# Patient Record
Sex: Female | Born: 1940 | Race: White | Hispanic: No | Marital: Married | Smoking: Never smoker
Health system: Southern US, Community
[De-identification: ages and names within clinical notes are randomized; demographics above are authoritative.]

## PROBLEM LIST (undated history)

## (undated) DIAGNOSIS — K219 Gastro-esophageal reflux disease without esophagitis: Secondary | ICD-10-CM

## (undated) DIAGNOSIS — R413 Other amnesia: Principal | ICD-10-CM

## (undated) DIAGNOSIS — M1990 Unspecified osteoarthritis, unspecified site: Secondary | ICD-10-CM

## (undated) DIAGNOSIS — G43909 Migraine, unspecified, not intractable, without status migrainosus: Secondary | ICD-10-CM

## (undated) HISTORY — PX: TUBAL LIGATION: SHX77

## (undated) HISTORY — PX: BREAST LUMPECTOMY: SHX2

## (undated) HISTORY — PX: WRIST SURGERY: SHX841

## (undated) HISTORY — PX: APPENDECTOMY: SHX54

## (undated) HISTORY — DX: Migraine, unspecified, not intractable, without status migrainosus: G43.909

## (undated) HISTORY — DX: Gastro-esophageal reflux disease without esophagitis: K21.9

## (undated) HISTORY — DX: Other amnesia: R41.3

## (undated) HISTORY — DX: Unspecified osteoarthritis, unspecified site: M19.90

---

## 1988-12-13 DIAGNOSIS — C50919 Malignant neoplasm of unspecified site of unspecified female breast: Secondary | ICD-10-CM

## 1988-12-13 HISTORY — DX: Malignant neoplasm of unspecified site of unspecified female breast: C50.919

## 1998-11-10 ENCOUNTER — Other Ambulatory Visit: Admission: RE | Admit: 1998-11-10 | Discharge: 1998-11-10 | Payer: Self-pay | Admitting: *Deleted

## 2000-01-27 ENCOUNTER — Other Ambulatory Visit: Admission: RE | Admit: 2000-01-27 | Discharge: 2000-01-27 | Payer: Self-pay | Admitting: *Deleted

## 2000-02-10 ENCOUNTER — Encounter: Admission: RE | Admit: 2000-02-10 | Discharge: 2000-02-10 | Payer: Self-pay | Admitting: *Deleted

## 2000-02-10 ENCOUNTER — Encounter: Payer: Self-pay | Admitting: *Deleted

## 2001-01-30 ENCOUNTER — Other Ambulatory Visit: Admission: RE | Admit: 2001-01-30 | Discharge: 2001-01-30 | Payer: Self-pay | Admitting: *Deleted

## 2001-02-16 ENCOUNTER — Encounter: Payer: Self-pay | Admitting: *Deleted

## 2001-02-16 ENCOUNTER — Encounter: Admission: RE | Admit: 2001-02-16 | Discharge: 2001-02-16 | Payer: Self-pay | Admitting: *Deleted

## 2002-02-01 ENCOUNTER — Other Ambulatory Visit: Admission: RE | Admit: 2002-02-01 | Discharge: 2002-02-01 | Payer: Self-pay | Admitting: *Deleted

## 2002-03-05 ENCOUNTER — Encounter: Payer: Self-pay | Admitting: *Deleted

## 2002-03-05 ENCOUNTER — Encounter: Admission: RE | Admit: 2002-03-05 | Discharge: 2002-03-05 | Payer: Self-pay | Admitting: *Deleted

## 2003-02-06 ENCOUNTER — Other Ambulatory Visit: Admission: RE | Admit: 2003-02-06 | Discharge: 2003-02-06 | Payer: Self-pay | Admitting: *Deleted

## 2004-10-29 ENCOUNTER — Ambulatory Visit: Payer: Self-pay | Admitting: Family Medicine

## 2004-11-11 ENCOUNTER — Ambulatory Visit: Payer: Self-pay

## 2005-03-03 ENCOUNTER — Other Ambulatory Visit: Admission: RE | Admit: 2005-03-03 | Discharge: 2005-03-03 | Payer: Self-pay | Admitting: *Deleted

## 2005-05-14 ENCOUNTER — Ambulatory Visit: Payer: Self-pay | Admitting: Family Medicine

## 2005-10-13 ENCOUNTER — Ambulatory Visit: Payer: Self-pay | Admitting: Family Medicine

## 2005-12-22 ENCOUNTER — Ambulatory Visit: Payer: Self-pay | Admitting: Family Medicine

## 2016-04-28 DIAGNOSIS — M810 Age-related osteoporosis without current pathological fracture: Secondary | ICD-10-CM | POA: Insufficient documentation

## 2016-06-22 DIAGNOSIS — R079 Chest pain, unspecified: Secondary | ICD-10-CM

## 2017-10-06 ENCOUNTER — Encounter: Payer: Self-pay | Admitting: Neurology

## 2018-01-11 ENCOUNTER — Ambulatory Visit: Payer: Medicare Other | Admitting: Neurology

## 2018-03-06 ENCOUNTER — Other Ambulatory Visit: Payer: Self-pay

## 2018-03-06 ENCOUNTER — Ambulatory Visit: Payer: Medicare Other | Admitting: Neurology

## 2018-03-06 ENCOUNTER — Encounter: Payer: Self-pay | Admitting: Neurology

## 2018-03-06 DIAGNOSIS — R413 Other amnesia: Secondary | ICD-10-CM | POA: Diagnosis not present

## 2018-03-06 HISTORY — DX: Other amnesia: R41.3

## 2018-03-06 MED ORDER — RIVASTIGMINE 4.6 MG/24HR TD PT24: 4.6000 mg | MEDICATED_PATCH | Freq: Every day | TRANSDERMAL | 5 refills | Status: DC

## 2018-03-06 NOTE — Progress Notes (Signed)
Reason for visit: Memory disturbance  Referring physician: Dr. Stark Bray Wohler is a 77 y.o. female  History of present illness:  Ms. Kyer is a 77 year old right-handed white female with a history of memory problems over the last 1 year.  The patient comes into the office today with her husband and her daughter.  The patient apparently has had some increasing problems with word finding and with remembering names for people.  She has had problems keeping up with the date.  She cannot find objects in the kitchen, she has had problems with keeping up with recipes with cooking.  The patient does not do a lot of cooking currently.  She has not been driving a car much, she has some troubles with directions with driving.  The patient is having difficulty keeping up with her medications and appointments, her husband has started doing this for her over the last several months.  The husband has always done the finances.  The patient reports that she sleeps well at night, she has a good energy level during the day.  She denies any numbness or weakness of the face, arms, or legs.  She has not had any significant problems with balance but she did fall 2 weeks ago and fractured her left foot.  She indicates that both her parents had memory problems.  She denies any issues controlling the bowels or the bladder.  She is sent to this office for further evaluation.  Apparently she has already had some blood work and she had MRI of the brain done at [facility].  I do not have the results of these evaluations.  The patient was placed on Namenda but she could not tolerate the medication secondary to nausea.  She has had some problems with weight loss, she is just not hungry anymore.  Past Medical History:  Diagnosis Date  . Memory difficulty 03/06/2018    Past Surgical History:  Procedure Laterality Date  . BREAST LUMPECTOMY    . TUBAL LIGATION    . WRIST SURGERY      Family History    Problem Relation Age of Onset  . Dementia Mother   . Cancer Mother   . Dementia Father   . Depression Father   . Cancer Brother   . Cancer Sister     Social history:  reports that she has never smoked. She has never used smokeless tobacco. She reports that she drinks alcohol. She reports that she does not use drugs.  Medications:  Prior to Admission medications   Medication Sig Start Date End Date Taking? Authorizing Provider  alendronate (FOSAMAX) 70 MG tablet Take 70 mg by mouth once a week. 02/18/18  Yes [provider]  ranitidine (ZANTAC) 300 MG tablet Take 1 tablet by mouth daily. 05/27/17  Yes [provider]  venlafaxine XR (EFFEXOR-XR) 37.5 MG 24 hr capsule Take 37.5 mg by mouth daily. 12/31/17  Yes [provider]      Allergies  Allergen Reactions  . Codeine Itching  . Isosorbide Nitrate Other (See Comments)    unknown Other reaction(s): Other (See Comments) unknown   . Morphine Other (See Comments)    unknown unknown     ROS:  Out of a complete 14 system review of symptoms, the patient complains only of the following symptoms, and all other reviewed systems are negative.  Decreased energy Memory loss, confusion  Blood pressure (!) 140/96, pulse 76, height 5\' 1"  (1.549 m), weight 97  lb (44 kg).  Physical Exam  General: The patient is alert and cooperative at the time of the examination.  The patient is thin.  Eyes: Pupils are equal, round, and reactive to light. Discs are flat bilaterally.  Neck: The neck is supple, no carotid bruits are noted.  Respiratory: The respiratory examination is clear.  Cardiovascular: The cardiovascular examination reveals a regular rate and rhythm, no obvious murmurs or rubs are noted.  Skin: Extremities are without significant edema.  Neurologic Exam  Mental status: The patient is alert and oriented x 2 at the time of the examination (not oriented to date). The Mini-Mental status examination  done today shows a total score of 23/30.  Cranial nerves: Facial symmetry is present. There is good sensation of the face to pinprick and soft touch bilaterally. The strength of the facial muscles and the muscles to head turning and shoulder shrug are normal bilaterally. Speech is well enunciated, no aphasia or dysarthria is noted. Extraocular movements are full. Visual fields are full. The tongue is midline, and the patient has symmetric elevation of the soft palate. No obvious hearing deficits are noted.  Motor: The motor testing reveals 5 over 5 strength of all 4 extremities. Good symmetric motor tone is noted throughout.  Sensory: Sensory testing is intact to pinprick, soft touch, vibration sensation, and position sense on all 4 extremities. No evidence of extinction is noted.  Coordination: Cerebellar testing reveals good finger-nose-finger and heel-to-shin bilaterally.  Gait and station: Gait is normal. Tandem gait is normal. Romberg is negative. No drift is seen.  Reflexes: Deep tendon reflexes are symmetric and normal bilaterally, with exception that the ankle jerk reflexes are depressed bilaterally. Toes are downgoing bilaterally.   Assessment/Plan:  1.  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  The patient has been tried on Namenda and could not tolerate the drug.  She will be given a trial on low-dose Exelon patch, but I am very concerned about problems with weight loss.  If this continues, the medication will need to be stopped.  The patient is to contact our office if she is interested in learning more about research for memory issues.  She will follow-up otherwise in 6 months.  We will need to get the report of the MRI of the brain done.     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  The small  vessel changes were mild.  Jill Alexanders MD 03/06/2018 9:54 AM  [facility] [address] [place], [place] [address]  Phone [phone] Fax [phone]

## 2018-03-06 NOTE — Patient Instructions (Signed)
   We will start Exelon patch for the memroy.  Begin Exelon patch. Look out for side effects that may include nausea, diarrhea, weight loss, or stomach cramps. This medication will also cause a runny nose, therefore there is no need for allergy medications for this purpose.

## 2018-03-07 ENCOUNTER — Telehealth: Payer: Self-pay | Admitting: *Deleted

## 2018-03-07 ENCOUNTER — Telehealth: Payer: Self-pay | Admitting: Neurology

## 2018-03-07 MED ORDER — MEMANTINE HCL 5 MG PO TABS
ORAL_TABLET | ORAL | 1 refills | Status: DC
Start: 2018-03-07 — End: 2018-04-26

## 2018-03-07 NOTE — Addendum Note (Signed)
Addended by: Kathrynn Ducking on: 03/07/2018 01:26 PM   Modules accepted: Orders

## 2018-03-07 NOTE — Telephone Encounter (Signed)
PA exelon patch denied. Pt must try/fail rivastigmine generic capsule first. I see where you called in Namenda instead. Just FYI in case patient cannot tolerate Namenda.

## 2018-03-07 NOTE — Telephone Encounter (Signed)
Pt husband(on DPR) is asking for a call to discuss rivastigmine (EXELON) 4.6 mg/24hr

## 2018-03-07 NOTE — Telephone Encounter (Signed)
The prescription for Exelon patch is too expensive.  The husband indicates that they want to retry the Namenda in low-dose to see if this is tolerated.  We will start 5 mg daily for 2 weeks and then go to 5 mg twice daily.

## 2018-03-07 NOTE — Telephone Encounter (Signed)
Called and spoke with husband (on [place]). He stated insurance does not cover Exelon patch and was told it would be $500. I advised we received request to do PA this am and it was completed and submitted. Waiting on determination. Should hear within 72 hr per insurance. He is wondering how much it will cost. I advised if it gets approved I can call pharmacy to see how much it will cost and let him know.   He is wondering if there is an alternative that can be given as a cheaper option. Inquired about rivastigmine capsule (this is on the formulary). Advised I will send message to Dr. Jannifer Franklin as well.  He verbalized understanding.

## 2018-03-07 NOTE — Telephone Encounter (Signed)
Received fax notification that PA denied. Pt must try/fail generic rivastigmine capsule first. Notified MD to advise on next steps.

## 2018-03-07 NOTE — Telephone Encounter (Signed)
Initiated PA Exelon patch 4.6 mg on covermymeds. Key: [id number]. In process of completing.

## 2018-03-07 NOTE — Telephone Encounter (Signed)
Submitted PA. Waiting on determination.  "[employer] is reviewing your PA request. Typically an electronic response will be received within 72 hours"

## 2018-04-26 ENCOUNTER — Other Ambulatory Visit: Payer: Self-pay | Admitting: Neurology

## 2018-08-23 ENCOUNTER — Other Ambulatory Visit: Payer: Self-pay | Admitting: Neurology

## 2018-09-12 ENCOUNTER — Encounter: Payer: Self-pay | Admitting: Neurology

## 2018-09-12 ENCOUNTER — Ambulatory Visit (INDEPENDENT_AMBULATORY_CARE_PROVIDER_SITE_OTHER): Payer: Medicare Other | Admitting: Neurology

## 2018-09-12 VITALS — BP 141/80 | HR 71 | Ht 61.0 in | Wt 96.5 lb

## 2018-09-12 DIAGNOSIS — R413 Other amnesia: Secondary | ICD-10-CM | POA: Diagnosis not present

## 2018-09-12 NOTE — Progress Notes (Signed)
Reason for visit: Memory disturbance  Alexandria Jones is an 77 y.o. female  History of present illness:  Alexandria Jones is a 77 year old right-handed white female with a history of a progressive memory disturbance.  The patient has been able to get back on low-dose Namenda taking 5 mg twice daily and she has tolerated this well.  The patient is maintaining her weight, she remains quite thin.  She does not operate a motor vehicle, she needs help keeping up with medications and appointments.  Her husband does the finances.  The patient is sleeping well at night, she has a good energy level during the day.  She has not had any safety issues at home, she does volunteer once a week and seems to do well with this.  She returns to this office for an evaluation.  Past Medical History:  Diagnosis Date  . Memory difficulty 03/06/2018    Past Surgical History:  Procedure Laterality Date  . BREAST LUMPECTOMY    . TUBAL LIGATION    . WRIST SURGERY      Family History  Problem Relation Age of Onset  . Dementia Mother   . Cancer Mother   . Dementia Father   . Depression Father   . Cancer Brother   . Cancer Sister     Social history:  reports that she has never smoked. She has never used smokeless tobacco. She reports that she drinks alcohol. She reports that she does not use drugs.    Allergies  Allergen Reactions  . Codeine Itching  . Isosorbide Nitrate Other (See Comments)    unknown Other reaction(s): Other (See Comments) unknown   . Morphine Other (See Comments)    unknown unknown     Medications:  Prior to Admission medications   Medication Sig Start Date End Date Taking? Authorizing Provider  alendronate (FOSAMAX) 70 MG tablet Take 70 mg by mouth once a week. 02/18/18  Yes [provider]  memantine (NAMENDA) 5 MG tablet TAKE 1 TABLET BY MOUTH TWICE DAILY 08/23/18  Yes Kathrynn Ducking, MD  venlafaxine XR (EFFEXOR-XR) 37.5 MG 24 hr capsule Take 37.5 mg by  mouth daily. 12/31/17  Yes [provider]    ROS:  Out of a complete 14 system review of symptoms, the patient complains only of the following symptoms, and all other reviewed systems are negative.  Snoring, sleep talking Memory loss  Blood pressure (!) 141/80, pulse 71, height 5\' 1"  (1.549 m), weight 96 lb 8 oz (43.8 kg).  Physical Exam  General: The patient is alert and cooperative at the time of the examination.  Skin: No significant peripheral edema is noted.   Neurologic Exam  Mental status: The patient is alert and oriented x 2 at the time of the examination (not oriented to date). The Mini-Mental status examination done today shows a total score of 19/30.   Cranial nerves: Facial symmetry is present. Speech is normal, no aphasia or dysarthria is noted. Extraocular movements are full. Visual fields are full.  Motor: The patient has good strength in all 4 extremities.  Sensory examination: Soft touch sensation is symmetric on the face, arms, and legs.  Coordination: The patient has good finger-nose-finger and heel-to-shin bilaterally.  Gait and station: The patient has a normal gait. Tandem gait is unsteady.  Romberg is negative. No drift is seen.  Reflexes: Deep tendon reflexes are symmetric.   Assessment/Plan:  1.  Progressive memory disturbance  The patient is able to  tolerate low-dose Namenda, we will keep this prescription going for now.  The patient lives at home with her husband who helps keep track of her medications.  The patient does not operate a motor vehicle.  She will follow-up in 6 months.  Greater than 50% of the visit was spent in counseling and coordination of care.  Face-to-face time with the patient was 20 minutes.   Jill Alexanders MD 09/12/2018 12:03 PM  [facility] [address] [place],  [address]  Phone [phone] Fax [phone]

## 2019-02-27 ENCOUNTER — Ambulatory Visit: Payer: Medicare Other | Admitting: Neurology

## 2019-02-27 ENCOUNTER — Ambulatory Visit: Payer: Medicare Other | Admitting: Nurse Practitioner

## 2019-03-09 ENCOUNTER — Other Ambulatory Visit: Payer: Self-pay | Admitting: Neurology

## 2019-03-14 ENCOUNTER — Other Ambulatory Visit: Payer: Self-pay

## 2019-03-14 ENCOUNTER — Encounter: Payer: Self-pay | Admitting: Neurology

## 2019-03-14 ENCOUNTER — Ambulatory Visit (INDEPENDENT_AMBULATORY_CARE_PROVIDER_SITE_OTHER): Payer: Medicare Other | Admitting: Neurology

## 2019-03-14 DIAGNOSIS — R413 Other amnesia: Secondary | ICD-10-CM

## 2019-03-14 NOTE — Progress Notes (Signed)
I have read the note, and I agree with the clinical assessment and plan.  Tyrice Hewitt K Maison Kestenbaum   

## 2019-03-14 NOTE — Progress Notes (Signed)
Virtual Visit via Video Note  I connected with Alexandria Jones on 03/14/19 at 10:15 AM EDT by a video enabled telemedicine application and verified that I am speaking with the correct person using two identifiers.   I discussed the limitations of evaluation and management by telemedicine and the availability of in person appointments. The patient expressed understanding and agreed to proceed.  History of Present Illness: 03/14/2019 SS: Alexandria Jones is a 78 yo female with history of progressive memory disturbance. She is tolerating low-dose Namenda 5 mg twice daily.  I did a virtual chat with Alexandria Jones and her husband today.  She reports that her memory is about the same, some days are worse than others.  She denies any major changes.  She currently lives with her husband.  He assists her with her medications, remembering appointments.  She is able to do her housework, ADLs.  Her husband does most of the cooking and manages the finances.  In the past they have managed the finances jointly.  She has not been driving for several months due to issues of getting lost.  She is currently taking Namenda 5 mg twice daily and is tolerating the medication well.  She reports that her appetite is fair, denies any weight loss.  She reports during the day she enjoys having lunch with friends, watching TV, reading.  She reports they were going to the gym however they have not been doing that recently due to the coronavirus.  She denies any changes in her medications or in her medical history.  She presents today for a virtual follow-up.   09/12/2018 Dr. Jannifer Franklin: Alexandria Jones is a 78 year old right-handed white female with a history of a progressive memory disturbance.  The patient has been able to get back on low-dose Namenda taking 5 mg twice daily and she has tolerated this well.  The patient is maintaining her weight, she remains quite thin.  She does not operate a motor vehicle, she needs help keeping up with  medications and appointments.  Her husband does the finances.  The patient is sleeping well at night, she has a good energy level during the day.  She has not had any safety issues at home, she does volunteer once a week and seems to do well with this.  She returns to this office for an evaluation.   Observations/Objective: I reviewed medications and medical history  Alert, appropriate verbal response, speech is clear, concise Symmetric facial movement, no facial droop, symmetric shoulder shrug, no arm drift, able to bring finger-to-nose to extension, gait is intact, steady, able to stand from seated position without pushing off  She scored a 17/22 on a Moca-Blind   Assessment and Plan: 1.  Memory disturbance  Overall she appears to be doing quite well.  She denies any major changes in her memory.  I did a Moca-blind and she scored 17/22 (normal >18). MMSE was 19/30 in October 2019. She will continue taking Namenda 5 mg twice daily.  She is tolerating medication well, has been unable to tolerate higher doses in the past. She doesn't need a refill.  We discussed best way to promote memory is to eat healthy, exercise, do brain stimulating exercises, manage risk factors.  She will follow-up in 6 months or sooner if needed.  I advised that if her symptoms worsen or she develops any symptoms she should let us know.  Follow Up Instructions: 6 months for a revisit, memory test   I discussed the assessment  and treatment plan with the patient. The patient was provided an opportunity to ask questions and all were answered. The patient agreed with the plan and demonstrated an understanding of the instructions.   The patient was advised to call back or seek an in-person evaluation if the symptoms worsen or if the condition fails to improve as anticipated.  I provided 25 minutes of non-face-to-face time during this encounter.   Evangeline Dakin, DNP  [facility] [address],  [place], [place] [address] [phone]

## 2019-04-25 ENCOUNTER — Telehealth: Payer: Self-pay

## 2019-04-25 NOTE — Telephone Encounter (Signed)
I called pt to schedule her for the 09/13/2019 follow up as recommended by Judson Roch, NP at the last visit. No answer, left a message asking her to call me back. If pt calls back, please assist her with this.

## 2019-08-18 ENCOUNTER — Other Ambulatory Visit: Payer: Self-pay | Admitting: Neurology

## 2019-09-06 ENCOUNTER — Other Ambulatory Visit: Payer: Self-pay | Admitting: Neurology

## 2019-11-01 ENCOUNTER — Other Ambulatory Visit: Payer: Self-pay | Admitting: *Deleted

## 2019-11-01 MED ORDER — MEMANTINE HCL 5 MG PO TABS: 5.0000 mg | ORAL_TABLET | Freq: Two times a day (BID) | ORAL | 5 refills | Status: DC

## 2019-11-01 NOTE — Telephone Encounter (Signed)
LMVM for pts husband to call back to r/s appt with SS/NP.

## 2019-12-18 DIAGNOSIS — Z0000 Encounter for general adult medical examination without abnormal findings: Secondary | ICD-10-CM | POA: Diagnosis not present

## 2019-12-18 DIAGNOSIS — Z6820 Body mass index (BMI) 20.0-20.9, adult: Secondary | ICD-10-CM | POA: Diagnosis not present

## 2019-12-25 DIAGNOSIS — F320 Major depressive disorder, single episode, mild: Secondary | ICD-10-CM | POA: Diagnosis not present

## 2019-12-25 DIAGNOSIS — F0280 Dementia in other diseases classified elsewhere without behavioral disturbance: Secondary | ICD-10-CM | POA: Diagnosis not present

## 2019-12-25 DIAGNOSIS — K219 Gastro-esophageal reflux disease without esophagitis: Secondary | ICD-10-CM | POA: Diagnosis not present

## 2019-12-25 DIAGNOSIS — Z6820 Body mass index (BMI) 20.0-20.9, adult: Secondary | ICD-10-CM | POA: Diagnosis not present

## 2020-01-07 DIAGNOSIS — M79671 Pain in right foot: Secondary | ICD-10-CM | POA: Diagnosis not present

## 2020-01-21 DIAGNOSIS — M79671 Pain in right foot: Secondary | ICD-10-CM | POA: Diagnosis not present

## 2020-01-28 ENCOUNTER — Telehealth (INDEPENDENT_AMBULATORY_CARE_PROVIDER_SITE_OTHER): Payer: Medicare PPO | Admitting: Family Medicine

## 2020-01-28 ENCOUNTER — Ambulatory Visit: Payer: Medicare PPO | Admitting: Family Medicine

## 2020-01-28 ENCOUNTER — Encounter: Payer: Self-pay | Admitting: Family Medicine

## 2020-01-28 ENCOUNTER — Other Ambulatory Visit: Payer: Self-pay

## 2020-01-28 DIAGNOSIS — R319 Hematuria, unspecified: Secondary | ICD-10-CM

## 2020-01-28 DIAGNOSIS — N3001 Acute cystitis with hematuria: Secondary | ICD-10-CM

## 2020-01-28 DIAGNOSIS — K219 Gastro-esophageal reflux disease without esophagitis: Secondary | ICD-10-CM | POA: Diagnosis not present

## 2020-01-28 DIAGNOSIS — R112 Nausea with vomiting, unspecified: Secondary | ICD-10-CM

## 2020-01-28 LAB — POCT URINALYSIS DIPSTICK
Bilirubin, UA: NEGATIVE
Glucose, UA: NEGATIVE
Ketones, UA: NEGATIVE
Nitrite, UA: POSITIVE
Protein, UA: POSITIVE — AB
Spec Grav, UA: 1.02 (ref 1.010–1.025)
Urobilinogen, UA: 0.2 E.U./dL
pH, UA: 6 (ref 5.0–8.0)

## 2020-01-28 MED ORDER — CIPROFLOXACIN HCL 250 MG PO TABS: 250.0000 mg | ORAL_TABLET | Freq: Two times a day (BID) | ORAL | 0 refills | Status: DC

## 2020-01-28 MED ORDER — OMEPRAZOLE 20 MG PO CPDR: 20.0000 mg | DELAYED_RELEASE_CAPSULE | Freq: Every day | ORAL | 3 refills | Status: DC

## 2020-01-28 NOTE — Patient Instructions (Signed)
Gerd: omeprazole 20 mg once daily. UTI (Bladder infection) - given cipro 250 mg one twice a day for 7 days. Fo Follow up in 2 weeks for repeat Urinalysis.  Gastroesophageal Reflux Disease, Adult (GERD) Gastroesophageal reflux (GER) happens when acid from the stomach flows up into the tube that connects the mouth and the stomach (esophagus). Normally, food travels down the esophagus and stays in the stomach to be digested. With GER, food and stomach acid sometimes move back up into the esophagus. You may have a disease called gastroesophageal reflux disease (GERD) if the reflux:  Happens often.  Causes frequent or very bad symptoms.  Causes problems such as damage to the esophagus. When this happens, the esophagus becomes sore and swollen (inflamed). Over time, GERD can make small holes (ulcers) in the lining of the esophagus. What are the causes? This condition is caused by a problem with the muscle between the esophagus and the stomach. When this muscle is weak or not normal, it does not close properly to keep food and acid from coming back up from the stomach. The muscle can be weak because of:  Tobacco use.  Pregnancy.  Having a certain type of hernia (hiatal hernia).  Alcohol use.  Certain foods and drinks, such as coffee, chocolate, onions, and peppermint. What increases the risk? You are more likely to develop this condition if you:  Are overweight.  Have a disease that affects your connective tissue.  Use NSAID medicines. What are the signs or symptoms? Symptoms of this condition include:  Heartburn.  Difficult or painful swallowing.  The feeling of having a lump in the throat.  A bitter taste in the mouth.  Bad breath.  Having a lot of saliva.  Having an upset or bloated stomach.  Belching.  Chest pain. Different conditions can cause chest pain. Make sure you see your doctor if you have chest pain.  Shortness of breath or noisy breathing  (wheezing).  Ongoing (chronic) cough or a cough at night.  Wearing away of the surface of teeth (tooth enamel).  Weight loss. How is this treated? Treatment will depend on how bad your symptoms are. Your doctor may suggest:  Changes to your diet.  Medicine.  Surgery. Follow these instructions at home: Eating and drinking   Follow a diet as told by your doctor. You may need to avoid foods and drinks such as: ? Coffee and tea (with or without caffeine). ? Drinks that contain alcohol. ? Energy drinks and sports drinks. ? Bubbly (carbonated) drinks or sodas. ? Chocolate and cocoa. ? Peppermint and mint flavorings. ? Garlic and onions. ? Horseradish. ? Spicy and acidic foods. These include peppers, chili powder, curry powder, vinegar, hot sauces, and BBQ sauce. ? Citrus fruit juices and citrus fruits, such as oranges, lemons, and limes. ? Tomato-based foods. These include red sauce, chili, salsa, and pizza with red sauce. ? Fried and fatty foods. These include donuts, french fries, potato chips, and high-fat dressings. ? High-fat meats. These include hot dogs, rib eye steak, sausage, ham, and bacon. ? High-fat dairy items, such as whole milk, butter, and cream cheese.  Eat small meals often. Avoid eating large meals.  Avoid drinking large amounts of liquid with your meals.  Avoid eating meals during the 2-3 hours before bedtime.  Avoid lying down right after you eat.  Do not exercise right after you eat. Lifestyle   Do not use any products that contain nicotine or tobacco. These include cigarettes, e-cigarettes, and chewing  tobacco. If you need help quitting, ask your doctor.  Try to lower your stress. If you need help doing this, ask your doctor.  If you are overweight, lose an amount of weight that is healthy for you. Ask your doctor about a safe weight loss goal. General instructions  Pay attention to any changes in your symptoms.  Take over-the-counter and  prescription medicines only as told by your doctor. Do not take aspirin, ibuprofen, or other NSAIDs unless your doctor says it is okay.  Wear loose clothes. Do not wear anything tight around your waist.  Raise (elevate) the head of your bed about 6 inches (15 cm).  Avoid bending over if this makes your symptoms worse.  Keep all follow-up visits as told by your doctor. This is important. Contact a doctor if:  You have new symptoms.  You lose weight and you do not know why.  You have trouble swallowing or it hurts to swallow.  You have wheezing or a cough that keeps happening.  Your symptoms do not get better with treatment.  You have a hoarse voice. Get help right away if:  You have pain in your arms, neck, jaw, teeth, or back.  You feel sweaty, dizzy, or light-headed.  You have chest pain or shortness of breath.  You throw up (vomit) and your throw-up looks like blood or coffee grounds.  You pass out (faint).  Your poop (stool) is bloody or black.  You cannot swallow, drink, or eat. Summary  If a person has gastroesophageal reflux disease (GERD), food and stomach acid move back up into the esophagus and cause symptoms or problems such as damage to the esophagus.  Treatment will depend on how bad your symptoms are.  Follow a diet as told by your doctor.  Take all medicines only as told by your doctor. This information is not intended to replace advice given to you by your health care provider. Make sure you discuss any questions you have with your health care provider. Document Revised: 06/07/2018 Document Reviewed: 06/07/2018 Elsevier Patient Education  2020 [place]. Acute Urinary Retention, Female  Acute urinary retention means that you cannot pee (urinate) at all, or that you pee too little and your bladder is not emptied completely. If it is not treated, it can lead to kidney damage or other serious problems. Follow these instructions at home:  Take  over-the-counter and prescription medicines only as told by your doctor. Ask your doctor what medicines you should stay away from. Do not take any medicine unless your doctor says it is okay to do so.  If you were sent home with a tube that drains pee from the bladder (catheter), take care of it as told by your doctor.  Drink enough fluid to keep your pee clear or pale yellow.  If you were given an antibiotic, take it as told by your doctor. Do not stop taking the antibiotic even if you start to feel better.  Do not use any products that contain nicotine or tobacco, such as cigarettes and e-cigarettes. If you need help quitting, ask your doctor.  Watch for changes in your symptoms. Tell your doctor about them.  If told, keep track of any changes in your blood pressure at home. Tell your doctor about them.  Keep all follow-up visits as told by your doctor. This is important. Contact a doctor if:  You have spasms or you leak pee when you have spasms. Get help right away if:  You  have chills or a fever.  You have blood in your pee.  You have a tube that drains the bladder and: ? The tube stops draining pee. ? The tube falls out. Summary  Acute urinary retention means that you cannot pee at all, or that you pee too little and your bladder is not emptied completely. If it is not treated, it can result in kidney damage or other serious problems.  If you were sent home with a tube that drains pee from the bladder, take care of it as told by your doctor.  Pay attention to any changes in your symptoms. Tell your doctor about them. This information is not intended to replace advice given to you by your health care provider. Make sure you discuss any questions you have with your health care provider. Document Revised: 11/11/2017 Document Reviewed: 12/31/2016 Elsevier Patient Education  [place].

## 2020-01-28 NOTE — Progress Notes (Signed)
Started as International aid/development worker but converted to Acute Office Visit  Subjective:    Patient ID: Alexandria Jones, female    DOB: 12-27-1940, 79 y.o.   MRN: [id number]  Chief Complaint  Patient presents with  . Dysuria  . Nausea  . Emesis    HPI: Patient is complaining of nausea, vomiting after meal without abdominal pain. Vomits whatever she eats. Denies weight loss.  Her appetite has significantly decreased over the last month.  Her husband reports she has probably vomited 15 times in the last month.  Past Medical History:  Diagnosis Date  . Breast cancer ([place]) 1990   cured S/P radiation treatment & chemo  . GERD (gastroesophageal reflux disease)   . Memory difficulty 03/06/2018  . Migraines   . Osteoarthritis     Past Surgical History:  Procedure Laterality Date  . APPENDECTOMY    . BREAST LUMPECTOMY    . left lumpectomy  1990  . TUBAL LIGATION    . WRIST SURGERY      Family History  Problem Relation Age of Onset  . Dementia Mother   . Cancer Mother   . Dementia Father   . Depression Father   . Cancer Brother   . Cancer Sister     Social History   Socioeconomic History  . Marital status: Married    Spouse name: Not on file  . Number of children: Not on file  . Years of education: Masters- education  . Highest education level: Not on file  Occupational History  . Not on file  Tobacco Use  . Smoking status: Never Smoker  . Smokeless tobacco: Never Used  Substance and Sexual Activity  . Alcohol use: Yes    Comment: Rare- wine  . Drug use: Never  . Sexual activity: Not on file  Other Topics Concern  . Not on file  Social History Narrative   Lives with husband   Caffeine use: sometimes tea   Right handed    Social Determinants of Health   Financial Resource Strain:   . Difficulty of Paying Living Expenses: Not on file  Food Insecurity:   . Worried About Charity fundraiser in the Last Year: Not on file  . Ran Out of Food in the Last Year: Not on file   Transportation Needs:   . Lack of Transportation (Medical): Not on file  . Lack of Transportation (Non-Medical): Not on file  Physical Activity:   . Days of Exercise per Week: Not on file  . Minutes of Exercise per Session: Not on file  Stress:   . Feeling of Stress : Not on file  Social Connections:   . Frequency of Communication with Friends and Family: Not on file  . Frequency of Social Gatherings with Friends and Family: Not on file  . Attends Religious Services: Not on file  . Active Member of Clubs or Organizations: Not on file  . Attends Archivist Meetings: Not on file  . Marital Status: Not on file  Intimate Partner Violence:   . Fear of Current or Ex-Partner: Not on file  . Emotionally Abused: Not on file  . Physically Abused: Not on file  . Sexually Abused: Not on file    Outpatient Medications Prior to Visit  Medication Sig Dispense Refill  . acetaminophen (TYLENOL) 500 MG tablet Take 1,000 mg by mouth every 6 (six) hours as needed.    . memantine (NAMENDA) 5 MG tablet Take 1 tablet (5 mg total)  by mouth 2 (two) times daily. 60 tablet 5  . venlafaxine XR (EFFEXOR-XR) 37.5 MG 24 hr capsule Take 37.5 mg by mouth daily.  0  . alendronate (FOSAMAX) 70 MG tablet Take 70 mg by mouth once a week.  0   No facility-administered medications prior to visit.    Allergies  Allergen Reactions  . Codeine Itching  . Morphine Nausea Only    unknown unknown   . Isosorbide Nitrate Other (See Comments)    unknown Other reaction(s): Other (See Comments) unknown     Review of Systems  Constitutional: Positive for fatigue. Negative for chills and fever.  HENT: Positive for voice change. Negative for congestion, ear pain and sore throat.        Hoarse   Respiratory: Negative for cough and shortness of breath.   Cardiovascular: Negative for chest pain.  Gastrointestinal: Positive for nausea and vomiting. Negative for abdominal pain, constipation and diarrhea.    Genitourinary: Positive for dysuria and frequency. Negative for urgency.       Comes and goes.  Neurological: Positive for headaches.       Objective:    Physical Exam  There were no vitals taken for this visit. Wt Readings from Last 3 Encounters:  09/12/18 96 lb 8 oz (43.8 kg)  03/06/18 97 lb (44 kg)     Physical Exam Vitals reviewed.  Constitutional:      Appearance: Normal appearance.  Neck:     Vascular: No carotid bruit.  Cardiovascular:     Rate and Rhythm: Normal rate and regular rhythm.     Pulses: Normal pulses.     Heart sounds: Normal heart sounds.  Pulmonary:     Effort: Pulmonary effort is normal.     Breath sounds: Normal breath sounds. No wheezing, rhonchi or rales.  Abdominal:     General: Bowel sounds are normal.     Palpations: Abdomen is soft.     Tenderness: There is suprapubic abdominal tenderness.  Neurological:     Mental Status: ALERT Psychiatric:        Mood and Affect: Mood normal.        Behavior: Behavior normal.       Assessment & Plan:   Problem List Items Addressed This Visit      Digestive   Non-intractable vomiting - Primary    Lab work done.  Differential diagnosis is GERD, urinary tract infection/pyelonephritis, versus biliary dyskinesia or gallstones.  Her exam is inconsistent with gallbladder.  Will treat bladder infection.  Will check blood work.  Start omeprazole 20 mg once daily.      Relevant Medications   omeprazole (PRILOSEC) 20 MG capsule   Other Relevant Orders   CBC with Differential/Platelet (Completed)   Comp. Metabolic Panel (12) (Completed)   Gastroesophageal reflux disease without esophagitis    Start omeprazole 20 mg once daily.      Relevant Medications   omeprazole (PRILOSEC) 20 MG capsule     Genitourinary   Acute cystitis with hematuria    Prescription for Cipro given.  Recommend bland diet.      Relevant Medications   ciprofloxacin (CIPRO) 250 MG tablet   Other Relevant Orders    Urinalysis Dipstick (Completed)   Urine Culture (Completed)       Meds ordered this encounter  Medications  . omeprazole (PRILOSEC) 20 MG capsule    Sig: Take 1 capsule (20 mg total) by mouth daily.    Dispense:  30 capsule  Refill:  3  . ciprofloxacin (CIPRO) 250 MG tablet    Sig: Take 1 tablet (250 mg total) by mouth 2 (two) times daily.    Dispense:  14 tablet    Refill:  0     Rochel Brome, MD

## 2020-01-29 LAB — COMP. METABOLIC PANEL (12)
AST: 17 IU/L (ref 0–40)
Albumin/Globulin Ratio: 1.9 (ref 1.2–2.2)
Albumin: 4.5 g/dL (ref 3.7–4.7)
Alkaline Phosphatase: 97 IU/L (ref 39–117)
BUN/Creatinine Ratio: 14 (ref 12–28)
BUN: 11 mg/dL (ref 8–27)
Bilirubin Total: 0.4 mg/dL (ref 0.0–1.2)
Calcium: 9.8 mg/dL (ref 8.7–10.3)
Chloride: 101 mmol/L (ref 96–106)
Creatinine, Ser: 0.81 mg/dL (ref 0.57–1.00)
GFR calc Af Amer: 79 mL/min/{1.73_m2} (ref >59)
GFR calc non Af Amer: 69 mL/min/{1.73_m2} (ref >59)
Globulin, Total: 2.4 g/dL (ref 1.5–4.5)
Glucose: 99 mg/dL (ref 65–99)
Potassium: 4.7 mmol/L (ref 3.5–5.2)
Sodium: 140 mmol/L (ref 134–144)
Total Protein: 6.9 g/dL (ref 6.0–8.5)

## 2020-01-29 LAB — CBC WITH DIFFERENTIAL/PLATELET
Basophils Absolute: 0 10*3/uL (ref 0.0–0.2)
Basos: 0 %
EOS (ABSOLUTE): 0.1 10*3/uL (ref 0.0–0.4)
Eos: 1 %
Hematocrit: 45.9 % (ref 34.0–46.6)
Hemoglobin: 15.6 g/dL (ref 11.1–15.9)
Immature Grans (Abs): 0 10*3/uL (ref 0.0–0.1)
Immature Granulocytes: 0 %
Lymphocytes Absolute: 1.3 10*3/uL (ref 0.7–3.1)
Lymphs: 14 %
MCH: 30.2 pg (ref 26.6–33.0)
MCHC: 34 g/dL (ref 31.5–35.7)
MCV: 89 fL (ref 79–97)
Monocytes Absolute: 1.2 10*3/uL — ABNORMAL HIGH (ref 0.1–0.9)
Monocytes: 13 %
Neutrophils Absolute: 6.7 10*3/uL (ref 1.4–7.0)
Neutrophils: 72 %
Platelets: 316 10*3/uL (ref 150–450)
RBC: 5.16 x10E6/uL (ref 3.77–5.28)
RDW: 11.9 % (ref 11.7–15.4)
WBC: 9.4 10*3/uL (ref 3.4–10.8)

## 2020-01-31 LAB — URINE CULTURE

## 2020-02-01 DIAGNOSIS — Z1212 Encounter for screening for malignant neoplasm of rectum: Secondary | ICD-10-CM | POA: Diagnosis not present

## 2020-02-01 DIAGNOSIS — Z1211 Encounter for screening for malignant neoplasm of colon: Secondary | ICD-10-CM | POA: Diagnosis not present

## 2020-02-01 LAB — COLOGUARD: Cologuard: NEGATIVE

## 2020-02-03 ENCOUNTER — Encounter: Payer: Self-pay | Admitting: Family Medicine

## 2020-02-03 DIAGNOSIS — R1110 Vomiting, unspecified: Secondary | ICD-10-CM | POA: Insufficient documentation

## 2020-02-03 DIAGNOSIS — N3001 Acute cystitis with hematuria: Secondary | ICD-10-CM | POA: Insufficient documentation

## 2020-02-03 DIAGNOSIS — K219 Gastro-esophageal reflux disease without esophagitis: Secondary | ICD-10-CM | POA: Insufficient documentation

## 2020-02-03 NOTE — Assessment & Plan Note (Addendum)
Lab work done.  Differential diagnosis is GERD, urinary tract infection/pyelonephritis, versus biliary dyskinesia or gallstones.  Her exam is inconsistent with gallbladder.  Will treat bladder infection.  Will check blood work.  Start omeprazole 20 mg once daily.

## 2020-02-03 NOTE — Assessment & Plan Note (Signed)
Prescription for Cipro given.  Recommend bland diet.

## 2020-02-03 NOTE — Assessment & Plan Note (Signed)
Start omeprazole 20 mg once daily.

## 2020-02-11 ENCOUNTER — Other Ambulatory Visit: Payer: Self-pay

## 2020-02-11 ENCOUNTER — Ambulatory Visit: Payer: Medicare PPO

## 2020-02-11 ENCOUNTER — Other Ambulatory Visit (INDEPENDENT_AMBULATORY_CARE_PROVIDER_SITE_OTHER): Payer: Medicare PPO

## 2020-02-11 DIAGNOSIS — R319 Hematuria, unspecified: Secondary | ICD-10-CM

## 2020-02-11 DIAGNOSIS — M79671 Pain in right foot: Secondary | ICD-10-CM | POA: Diagnosis not present

## 2020-02-11 LAB — POCT URINALYSIS DIPSTICK
Blood, UA: NEGATIVE
Glucose, UA: NEGATIVE
Leukocytes, UA: NEGATIVE
Nitrite, UA: NEGATIVE
Protein, UA: POSITIVE — AB
Spec Grav, UA: 1.025 (ref 1.010–1.025)
Urobilinogen, UA: 0.2 E.U./dL
pH, UA: 6 (ref 5.0–8.0)

## 2020-03-02 NOTE — Progress Notes (Signed)
Acute Office Visit  Subjective:    Patient ID: Alexandria Jones, female    DOB: 17-Mar-1941, 79 y.o.   MRN: [id number]  Chief Complaint  Patient presents with  . Hematuria  . Nausea    HPI Patient is in today complaining of blood in her urine and nausea.  Denies abdominal pain.  Patient does experience dysuria.  Denies flank pain.  Past Medical History:  Diagnosis Date  . Breast cancer ([place]) 1990   cured S/P radiation treatment & chemo  . GERD (gastroesophageal reflux disease)   . Memory difficulty 03/06/2018  . Migraines   . Osteoarthritis     Past Surgical History:  Procedure Laterality Date  . APPENDECTOMY    . BREAST LUMPECTOMY    . left lumpectomy  1990  . TUBAL LIGATION    . WRIST SURGERY      Family History  Problem Relation Age of Onset  . Dementia Mother   . Cancer Mother   . Dementia Father   . Depression Father   . Cancer Brother   . Cancer Sister     Social History   Socioeconomic History  . Marital status: Married    Spouse name: Not on file  . Number of children: Not on file  . Years of education: Masters- education  . Highest education level: Not on file  Occupational History  . Not on file  Tobacco Use  . Smoking status: Never Smoker  . Smokeless tobacco: Never Used  Substance and Sexual Activity  . Alcohol use: Yes    Comment: Rare- wine  . Drug use: Never  . Sexual activity: Not on file  Other Topics Concern  . Not on file  Social History Narrative   Lives with husband   Caffeine use: sometimes tea   Right handed    Social Determinants of Health   Financial Resource Strain:   . Difficulty of Paying Living Expenses:   Food Insecurity:   . Worried About Charity fundraiser in the Last Year:   . Arboriculturist in the Last Year:   Transportation Needs:   . Film/video editor (Medical):   Marland Kitchen Lack of Transportation (Non-Medical):   Physical Activity:   . Days of Exercise per Week:   . Minutes of Exercise per Session:    Stress:   . Feeling of Stress :   Social Connections:   . Frequency of Communication with Friends and Family:   . Frequency of Social Gatherings with Friends and Family:   . Attends Religious Services:   . Active Member of Clubs or Organizations:   . Attends Archivist Meetings:   Marland Kitchen Marital Status:   Intimate Partner Violence:   . Fear of Current or Ex-Partner:   . Emotionally Abused:   Marland Kitchen Physically Abused:   . Sexually Abused:     Outpatient Medications Prior to Visit  Medication Sig Dispense Refill  . acetaminophen (TYLENOL) 500 MG tablet Take 1,000 mg by mouth every 6 (six) hours as needed.    . memantine (NAMENDA) 5 MG tablet Take 1 tablet (5 mg total) by mouth 2 (two) times daily. 60 tablet 5  . omeprazole (PRILOSEC) 20 MG capsule Take 1 capsule (20 mg total) by mouth daily. 30 capsule 3  . venlafaxine XR (EFFEXOR-XR) 37.5 MG 24 hr capsule Take 37.5 mg by mouth daily.  0   No facility-administered medications prior to visit.    Allergies  Allergen Reactions  .  Codeine Itching  . Morphine Nausea Only    unknown unknown   . Isosorbide Nitrate Other (See Comments)    unknown Other reaction(s): Other (See Comments) unknown     Review of Systems  Constitutional: Negative for chills, fatigue and fever.  HENT: Negative for congestion, ear pain and sore throat.   Respiratory: Negative for cough and shortness of breath.   Cardiovascular: Negative for chest pain.       Objective:    Physical Exam Vitals reviewed.  Constitutional:      Appearance: Normal appearance. She is normal weight.  Cardiovascular:     Rate and Rhythm: Normal rate and regular rhythm.     Pulses: Normal pulses.     Heart sounds: Normal heart sounds.  Pulmonary:     Effort: Pulmonary effort is normal. No respiratory distress.     Breath sounds: Normal breath sounds.  Abdominal:     General: Abdomen is flat. Bowel sounds are normal.     Palpations: Abdomen is soft.      Tenderness: There is no abdominal tenderness.  Neurological:     Mental Status: She is alert and oriented to person, place, and time.  Psychiatric:        Mood and Affect: Mood normal.        Behavior: Behavior normal.     There were no vitals taken for this visit. Wt Readings from Last 3 Encounters:  09/12/18 96 lb 8 oz (43.8 kg)  03/06/18 97 lb (44 kg)    Health Maintenance Due  Topic Date Due  . TETANUS/TDAP  Never done  . DEXA SCAN  Never done  . PNA vac Low Risk Adult (1 of 2 - PCV13) Never done  . INFLUENZA VACCINE  07/14/2019    There are no preventive care reminders to display for this patient.   No results found for: TSH Lab Results  Component Value Date   WBC 9.4 01/28/2020   HGB 15.6 01/28/2020   HCT 45.9 01/28/2020   MCV 89 01/28/2020   PLT 316 01/28/2020   Lab Results  Component Value Date   NA 140 01/28/2020   K 4.7 01/28/2020   GLUCOSE 99 01/28/2020   BUN 11 01/28/2020   CREATININE 0.81 01/28/2020   BILITOT 0.4 01/28/2020   ALKPHOS 97 01/28/2020   AST 17 01/28/2020   PROT 6.9 01/28/2020   ALBUMIN 4.5 01/28/2020   CALCIUM 9.8 01/28/2020   No results found for: CHOL No results found for: HDL No results found for: LDLCALC No results found for: TRIG No results found for: CHOLHDL No results found for: HGBA1C     Assessment & Plan:   Problem List Items Addressed This Visit      Other   Hematuria - Primary - resolved.   Relevant Orders   Urinalysis Dipstick      Rochel Brome, MD

## 2020-03-03 ENCOUNTER — Telehealth: Payer: Self-pay

## 2020-03-03 NOTE — Telephone Encounter (Signed)
Patient informed of negative cologuard results and the need to repeat test in 3 years.

## 2020-03-09 ENCOUNTER — Encounter: Payer: Self-pay | Admitting: Family Medicine

## 2020-03-09 DIAGNOSIS — R319 Hematuria, unspecified: Secondary | ICD-10-CM | POA: Insufficient documentation

## 2020-03-09 LAB — POCT URINALYSIS DIPSTICK
Blood, UA: NEGATIVE
Leukocytes, UA: NEGATIVE
Nitrite, UA: NEGATIVE

## 2020-03-12 ENCOUNTER — Other Ambulatory Visit: Payer: Self-pay | Admitting: Family Medicine

## 2020-03-17 ENCOUNTER — Other Ambulatory Visit: Payer: Self-pay

## 2020-03-17 ENCOUNTER — Encounter: Payer: Self-pay | Admitting: Nurse Practitioner

## 2020-03-17 ENCOUNTER — Ambulatory Visit: Payer: Medicare PPO | Admitting: Nurse Practitioner

## 2020-03-17 VITALS — BP 128/82 | HR 92 | Temp 97.9°F | Ht 61.5 in | Wt 111.0 lb

## 2020-03-17 DIAGNOSIS — R300 Dysuria: Secondary | ICD-10-CM | POA: Diagnosis not present

## 2020-03-17 DIAGNOSIS — M810 Age-related osteoporosis without current pathological fracture: Secondary | ICD-10-CM

## 2020-03-17 DIAGNOSIS — R3129 Other microscopic hematuria: Secondary | ICD-10-CM | POA: Diagnosis not present

## 2020-03-17 DIAGNOSIS — Z853 Personal history of malignant neoplasm of breast: Secondary | ICD-10-CM

## 2020-03-17 LAB — POCT URINALYSIS DIPSTICK
Glucose, UA: NEGATIVE
Ketones, UA: NEGATIVE
Nitrite, UA: NEGATIVE
Protein, UA: POSITIVE — AB
Spec Grav, UA: 1.02 (ref 1.010–1.025)
Urobilinogen, UA: NEGATIVE E.U./dL — AB
pH, UA: 6 (ref 5.0–8.0)

## 2020-03-17 MED ORDER — NITROFURANTOIN MONOHYD MACRO 100 MG PO CAPS: 100.0000 mg | ORAL_CAPSULE | Freq: Two times a day (BID) | ORAL | 0 refills | Status: DC

## 2020-03-17 NOTE — Patient Instructions (Signed)
Burning with urination Not well controlled, UA dip stick positive for uti, cultures sent off. Pending results. Patient will start on 100 mg nitrofurantoin for 7 days, pending urine culture. Advised to increase fluid.  Hematuria Urine sent for culture. Pending result.     Urinary Tract Infection, Adult A urinary tract infection (UTI) is an infection of any part of the urinary tract. The urinary tract includes:  The kidneys.  The ureters.  The bladder.  The urethra. These organs make, store, and get rid of pee (urine) in the body. What are the causes? This is caused by germs (bacteria) in your genital area. These germs grow and cause swelling (inflammation) of your urinary tract. What increases the risk? You are more likely to develop this condition if:  You have a small, thin tube (catheter) to drain pee.  You cannot control when you pee or poop (incontinence).  You are female, and: ? You use these methods to prevent pregnancy:  A medicine that kills sperm (spermicide).  A device that blocks sperm (diaphragm). ? You have low levels of a female hormone (estrogen). ? You are pregnant.  You have genes that add to your risk.  You are sexually active.  You take antibiotic medicines.  You have trouble peeing because of: ? A prostate that is bigger than normal, if you are female. ? A blockage in the part of your body that drains pee from the bladder (urethra). ? A kidney stone. ? A nerve condition that affects your bladder (neurogenic bladder). ? Not getting enough to drink. ? Not peeing often enough.  You have other conditions, such as: ? Diabetes. ? A weak disease-fighting system (immune system). ? Sickle cell disease. ? Gout. ? Injury of the spine. What are the signs or symptoms? Symptoms of this condition include:  Needing to pee right away (urgently).  Peeing often.  Peeing small amounts often.  Pain or burning when peeing.  Blood in the pee.  Pee that  smells bad or not like normal.  Trouble peeing.  Pee that is cloudy.  Fluid coming from the vagina, if you are female.  Pain in the belly or lower back. Other symptoms include:  Throwing up (vomiting).  No urge to eat.  Feeling mixed up (confused).  Being tired and grouchy (irritable).  A fever.  Watery poop (diarrhea). How is this treated? This condition may be treated with:  Antibiotic medicine.  Other medicines.  Drinking enough water. Follow these instructions at home:  Medicines  Take over-the-counter and prescription medicines only as told by your doctor.  If you were prescribed an antibiotic medicine, take it as told by your doctor. Do not stop taking it even if you start to feel better. General instructions  Make sure you: ? Pee until your bladder is empty. ? Do not hold pee for a long time. ? Empty your bladder after sex. ? Wipe from front to back after pooping if you are a female. Use each tissue one time when you wipe.  Drink enough fluid to keep your pee pale yellow.  Keep all follow-up visits as told by your doctor. This is important. Contact a doctor if:  You do not get better after 1-2 days.  Your symptoms go away and then come back. Get help right away if:  You have very bad back pain.  You have very bad pain in your lower belly.  You have a fever.  You are sick to your stomach (nauseous).  You  are throwing up. Summary  A urinary tract infection (UTI) is an infection of any part of the urinary tract.  This condition is caused by germs in your genital area.  There are many risk factors for a UTI. These include having a small, thin tube to drain pee and not being able to control when you pee or poop.  Treatment includes antibiotic medicines for germs.  Drink enough fluid to keep your pee pale yellow. This information is not intended to replace advice given to you by your health care provider. Make sure you discuss any questions  you have with your health care provider. Document Revised: 11/16/2018 Document Reviewed: 06/08/2018 Elsevier Patient Education  2020 [employer].

## 2020-03-17 NOTE — Assessment & Plan Note (Signed)
Not well controlled, UA dip stick positive for uti, cultures sent off. Pending results. Patient will start on 100 mg nitrofurantoin for 7 days, pending urine culture. Advised to increase fluid.

## 2020-03-17 NOTE — Assessment & Plan Note (Signed)
Urine sent for culture. Pending result.

## 2020-03-17 NOTE — Progress Notes (Signed)
Established Patient Office Visit  Subjective:  Patient ID: Alexandria Jones, female    DOB: 1941-06-24  Age: 79 y.o. MRN: [id number]  CC: Patient  is a 79 year old female. She is here for UTI. The patient's medications were reviewed and reconciled since the patient's last visit.  History details were provided by the patient. The history appears to be reliable.   Chief Complaint  Patient presents with  . Urinary Tract Infection    Burning when urinating    HPI  Alexandria Jones is a 79 y.o. female who complains of urinary frequency, Burning,  urgency and dysuria in the last 2 days, without flank pain, fever, chills, or abnormal vaginal discharge or bleeding.     Past Medical History:  Diagnosis Date  . Breast cancer ([place]) 1990   cured S/P radiation treatment & chemo  . GERD (gastroesophageal reflux disease)   . Memory difficulty 03/06/2018  . Migraines   . Osteoarthritis     Past Surgical History:  Procedure Laterality Date  . APPENDECTOMY    . BREAST LUMPECTOMY    . left lumpectomy  1990  . TUBAL LIGATION    . WRIST SURGERY      Family History  Problem Relation Age of Onset  . Dementia Mother   . Cancer Mother   . Dementia Father   . Depression Father   . Cancer Brother   . Cancer Sister     Social History   Socioeconomic History  . Marital status: Married    Spouse name: Not on file  . Number of children: Not on file  . Years of education: Masters- education  . Highest education level: Not on file  Occupational History  . Not on file  Tobacco Use  . Smoking status: Never Smoker  . Smokeless tobacco: Never Used  Substance and Sexual Activity  . Alcohol use: Yes    Comment: Rare- wine  . Drug use: Never  . Sexual activity: Not on file  Other Topics Concern  . Not on file  Social History Narrative   Lives with husband   Caffeine use: sometimes tea   Right handed    Social Determinants of Health   Financial Resource Strain:   . Difficulty  of Paying Living Expenses:   Food Insecurity:   . Worried About Charity fundraiser in the Last Year:   . Arboriculturist in the Last Year:   Transportation Needs:   . Film/video editor (Medical):   Marland Kitchen Lack of Transportation (Non-Medical):   Physical Activity:   . Days of Exercise per Week:   . Minutes of Exercise per Session:   Stress:   . Feeling of Stress :   Social Connections:   . Frequency of Communication with Friends and Family:   . Frequency of Social Gatherings with Friends and Family:   . Attends Religious Services:   . Active Member of Clubs or Organizations:   . Attends Archivist Meetings:   Marland Kitchen Marital Status:   Intimate Partner Violence:   . Fear of Current or Ex-Partner:   . Emotionally Abused:   Marland Kitchen Physically Abused:   . Sexually Abused:     Outpatient Medications Prior to Visit  Medication Sig Dispense Refill  . acetaminophen (TYLENOL) 500 MG tablet Take 1,000 mg by mouth every 6 (six) hours as needed.    . memantine (NAMENDA) 5 MG tablet Take 1 tablet (5 mg total) by mouth 2 (  two) times daily. 60 tablet 5  . omeprazole (PRILOSEC) 20 MG capsule Take 1 capsule (20 mg total) by mouth daily. 30 capsule 3  . venlafaxine XR (EFFEXOR-XR) 37.5 MG 24 hr capsule TAKE 1 CAPSULE BY MOUTH DAILY 90 capsule 0  . ciprofloxacin (CIPRO) 250 MG tablet Take 1 tablet (250 mg total) by mouth 2 (two) times daily. 14 tablet 0   No facility-administered medications prior to visit.    Allergies  Allergen Reactions  . Codeine Itching  . Morphine Nausea Only    unknown unknown   . Isosorbide Nitrate Other (See Comments)    unknown Other reaction(s): Other (See Comments) unknown     ROS Review of Systems  Constitutional: Positive for fever. Negative for activity change and appetite change.  HENT: Negative for congestion.   Cardiovascular: Negative for chest pain and palpitations.  Gastrointestinal: Negative for abdominal distention and abdominal pain.    Genitourinary: Positive for difficulty urinating, dyspareunia, hematuria and urgency. Negative for flank pain and pelvic pain.       Burning with urination  Musculoskeletal: Negative for arthralgias and myalgias.  Skin: Negative for rash.      Objective:    Physical Exam  Constitutional: She is oriented to person, place, and time. She appears well-developed and well-nourished.  HENT:  Mouth/Throat: Oropharynx is clear and moist.  Eyes: Conjunctivae are normal.  Cardiovascular: Normal rate, regular rhythm and normal heart sounds.  Pulmonary/Chest: Breath sounds normal.  Abdominal: Bowel sounds are normal. There is no abdominal tenderness.  Genitourinary:    Genitourinary Comments: Burning with urination   Musculoskeletal:        General: No tenderness.     Cervical back: Neck supple.  Neurological: She is alert and oriented to person, place, and time.  Skin: Skin is warm. No rash noted.  Psychiatric: She has a normal mood and affect.    BP 128/82 (BP Location: Right Arm, Patient Position: Sitting)   Pulse 92   Temp 97.9 F (36.6 C) (Temporal)   Ht 5' 1.5" (1.562 m)   Wt 111 lb (50.3 kg)   SpO2 97%   BMI 20.63 kg/m  Wt Readings from Last 3 Encounters:  03/17/20 111 lb (50.3 kg)  09/12/18 96 lb 8 oz (43.8 kg)  03/06/18 97 lb (44 kg)     Health Maintenance Due  Topic Date Due  . MAMMOGRAM  12/26/2018  . DEXA SCAN  12/27/2019    There are no preventive care reminders to display for this patient.  No results found for: TSH Lab Results  Component Value Date   WBC 9.4 01/28/2020   HGB 15.6 01/28/2020   HCT 45.9 01/28/2020   MCV 89 01/28/2020   PLT 316 01/28/2020   Lab Results  Component Value Date   NA 140 01/28/2020   K 4.7 01/28/2020   GLUCOSE 99 01/28/2020   BUN 11 01/28/2020   CREATININE 0.81 01/28/2020   BILITOT 0.4 01/28/2020   ALKPHOS 97 01/28/2020   AST 17 01/28/2020   PROT 6.9 01/28/2020   ALBUMIN 4.5 01/28/2020   CALCIUM 9.8 01/28/2020    No results found for: CHOL No results found for: HDL No results found for: LDLCALC No results found for: TRIG No results found for: CHOLHDL No results found for: HGBA1C    Assessment & Plan:  ASSESSMENT: UTI uncomplicated without evidence of pyelonephritis Appears well, in no apparent distress.  Vital signs are normal. The abdomen is soft without tenderness, guarding, mass, rebound or  organomegaly. No CVA tenderness or inguinal adenopathy noted. Urine dipstick shows leukocytes    Treatment per orders - also push fluids, may use Pyridium OTC prn. Call or return to clinic prn if these symptoms worsen or fail to improve as anticipated.    Problem List Items Addressed This Visit      Other   Hematuria    Urine sent for culture. Pending result.       Relevant Orders   Urine Culture   Burning with urination - Primary    Not well controlled, UA dip stick positive for uti, cultures sent off. Pending results. Patient will start on 100 mg nitrofurantoin for 7 days, pending urine culture. Advised to increase fluid.      Relevant Orders   POCT urinalysis dipstick (Completed)      Meds ordered this encounter  Medications  . nitrofurantoin, macrocrystal-monohydrate, (MACROBID) 100 MG capsule    Sig: Take 1 capsule (100 mg total) by mouth 2 (two) times daily.    Dispense:  14 capsule    Refill:  0    Order Specific Question:   Supervising Provider    Answer:   Shelton Silvas    Follow-up: Return if symptoms worsen or fail to improve.    Ivy Lynn, NP

## 2020-03-19 LAB — URINE CULTURE

## 2020-03-24 NOTE — Progress Notes (Signed)
Established Patient Office Visit  Subjective:  Patient ID: Alexandria Jones, female    DOB: Mar 08, 1941  Age: 79 y.o. MRN: [id number]  CC:  Chief Complaint  Patient presents with  . Urinary Tract Infection    HPI Alexandria Jones presents for dysuria.  The patient was seen on March 17, 2020 here in our office by a midlevel provider and treated with Macrobid for a positive urinalysis.  The patient did not receive a phone call about her urine culture which showed intermediate resistance to Macrobid.  She did complete the treatment.  Majority of her symptoms resolved other than this mild dysuria. Past Medical History:  Diagnosis Date  . Breast cancer ([place]) 1990   cured S/P radiation treatment & chemo  . GERD (gastroesophageal reflux disease)   . Memory difficulty 03/06/2018  . Migraines   . Osteoarthritis     Past Surgical History:  Procedure Laterality Date  . APPENDECTOMY    . BREAST LUMPECTOMY    . left lumpectomy  1990  . TUBAL LIGATION    . WRIST SURGERY      Family History  Problem Relation Age of Onset  . Dementia Mother   . Cancer Mother   . Dementia Father   . Depression Father   . Cancer Brother   . Cancer Sister     Social History   Socioeconomic History  . Marital status: Married    Spouse name: Not on file  . Number of children: Not on file  . Years of education: Masters- education  . Highest education level: Not on file  Occupational History  . Not on file  Tobacco Use  . Smoking status: Never Smoker  . Smokeless tobacco: Never Used  Substance and Sexual Activity  . Alcohol use: Yes    Comment: Rare- wine  . Drug use: Never  . Sexual activity: Not on file  Other Topics Concern  . Not on file  Social History Narrative   Lives with husband   Caffeine use: sometimes tea   Right handed    Social Determinants of Health   Financial Resource Strain:   . Difficulty of Paying Living Expenses:   Food Insecurity:   . Worried About Paediatric nurse in the Last Year:   . Arboriculturist in the Last Year:   Transportation Needs:   . Film/video editor (Medical):   Marland Kitchen Lack of Transportation (Non-Medical):   Physical Activity:   . Days of Exercise per Week:   . Minutes of Exercise per Session:   Stress:   . Feeling of Stress :   Social Connections:   . Frequency of Communication with Friends and Family:   . Frequency of Social Gatherings with Friends and Family:   . Attends Religious Services:   . Active Member of Clubs or Organizations:   . Attends Archivist Meetings:   Marland Kitchen Marital Status:   Intimate Partner Violence:   . Fear of Current or Ex-Partner:   . Emotionally Abused:   Marland Kitchen Physically Abused:   . Sexually Abused:     Outpatient Medications Prior to Visit  Medication Sig Dispense Refill  . acetaminophen (TYLENOL) 500 MG tablet Take 1,000 mg by mouth every 6 (six) hours as needed.    . memantine (NAMENDA) 5 MG tablet Take 1 tablet (5 mg total) by mouth 2 (two) times daily. 60 tablet 5  . omeprazole (PRILOSEC) 20 MG capsule Take 1 capsule (20 mg  total) by mouth daily. 30 capsule 3  . venlafaxine XR (EFFEXOR-XR) 37.5 MG 24 hr capsule TAKE 1 CAPSULE BY MOUTH DAILY 90 capsule 0  . nitrofurantoin, macrocrystal-monohydrate, (MACROBID) 100 MG capsule Take 1 capsule (100 mg total) by mouth 2 (two) times daily. 14 capsule 0   No facility-administered medications prior to visit.    Allergies  Allergen Reactions  . Codeine Itching  . Morphine Nausea Only    unknown unknown   . Isosorbide Nitrate Other (See Comments)    unknown Other reaction(s): Other (See Comments) unknown     ROS Review of Systems  Constitutional: Negative for chills, fatigue and fever.  HENT: Negative for congestion, ear pain, rhinorrhea and sore throat.   Respiratory: Negative for cough and shortness of breath.   Cardiovascular: Negative for chest pain.  Gastrointestinal: Negative for abdominal pain, constipation,  diarrhea, nausea and vomiting.  Genitourinary: Negative for dysuria and urgency.  Musculoskeletal: Negative for back pain and myalgias.  Neurological: Negative for dizziness, weakness, light-headedness and headaches.  Psychiatric/Behavioral: Negative for dysphoric mood. The patient is not nervous/anxious.       Objective:    Physical Exam  Constitutional: She appears well-developed and well-nourished.  Cardiovascular: Normal rate, regular rhythm and normal heart sounds.  Pulmonary/Chest: Effort normal and breath sounds normal.  Abdominal: Soft. Bowel sounds are normal. There is no abdominal tenderness.  Neurological: She is alert.  Psychiatric: She has a normal mood and affect. Her behavior is normal.    BP 106/60   Pulse 88   Temp (!) 96.9 F (36.1 C)   Resp 16   Ht 5\' 2"  (1.575 m)   Wt 110 lb 3.2 oz (50 kg)   BMI 20.16 kg/m  Wt Readings from Last 3 Encounters:  03/25/20 110 lb 3.2 oz (50 kg)  03/17/20 111 lb (50.3 kg)  09/12/18 96 lb 8 oz (43.8 kg)     Health Maintenance Due  Topic Date Due  . COVID-19 Vaccine (1) Never done  . MAMMOGRAM  12/26/2018  . DEXA SCAN  12/27/2019    There are no preventive care reminders to display for this patient.  No results found for: TSH Lab Results  Component Value Date   WBC 9.4 01/28/2020   HGB 15.6 01/28/2020   HCT 45.9 01/28/2020   MCV 89 01/28/2020   PLT 316 01/28/2020   Lab Results  Component Value Date   NA 140 01/28/2020   K 4.7 01/28/2020   GLUCOSE 99 01/28/2020   BUN 11 01/28/2020   CREATININE 0.81 01/28/2020   BILITOT 0.4 01/28/2020   ALKPHOS 97 01/28/2020   AST 17 01/28/2020   PROT 6.9 01/28/2020   ALBUMIN 4.5 01/28/2020   CALCIUM 9.8 01/28/2020   No results found for: CHOL No results found for: HDL No results found for: LDLCALC No results found for: TRIG No results found for: CHOLHDL No results found for: HGBA1C    Assessment & Plan:  1. Acute cystitis without hematuria Concern patient's  previous urinary tract infection was not fully treated.  Will prescribe Augmentin 1 pill twice daily for 3 days. - POCT urinalysis dipstick - amoxicillin-clavulanate (AUGMENTIN) 875-125 MG tablet; Take 1 tablet by mouth 2 (two) times daily.  Dispense: 6 tablet; Refill: 0 - Urine Culture   Meds ordered this encounter  Medications  . amoxicillin-clavulanate (AUGMENTIN) 875-125 MG tablet    Sig: Take 1 tablet by mouth 2 (two) times daily.    Dispense:  6 tablet  Refill:  0    Follow-up: No follow-ups on file.    Rochel Brome, MD

## 2020-03-25 ENCOUNTER — Encounter: Payer: Self-pay | Admitting: Family Medicine

## 2020-03-25 ENCOUNTER — Other Ambulatory Visit: Payer: Self-pay

## 2020-03-25 ENCOUNTER — Ambulatory Visit: Payer: Medicare PPO | Admitting: Family Medicine

## 2020-03-25 VITALS — BP 106/60 | HR 88 | Temp 96.9°F | Resp 16 | Ht 62.0 in | Wt 110.2 lb

## 2020-03-25 DIAGNOSIS — N3000 Acute cystitis without hematuria: Secondary | ICD-10-CM | POA: Diagnosis not present

## 2020-03-25 LAB — POCT URINALYSIS DIPSTICK
Bilirubin, UA: NEGATIVE
Blood, UA: NEGATIVE
Glucose, UA: NEGATIVE
Ketones, UA: NEGATIVE
Leukocytes, UA: NEGATIVE
Nitrite, UA: NEGATIVE
Protein, UA: POSITIVE — AB
Spec Grav, UA: 1.015 (ref 1.010–1.025)
Urobilinogen, UA: 0.2 E.U./dL
pH, UA: 6 (ref 5.0–8.0)

## 2020-03-25 MED ORDER — AMOXICILLIN-POT CLAVULANATE 875-125 MG PO TABS: 1.0000 | ORAL_TABLET | Freq: Two times a day (BID) | ORAL | 0 refills | Status: DC

## 2020-03-27 LAB — URINE CULTURE

## 2020-03-30 ENCOUNTER — Encounter: Payer: Self-pay | Admitting: Family Medicine

## 2020-04-10 ENCOUNTER — Other Ambulatory Visit: Payer: Self-pay

## 2020-04-10 ENCOUNTER — Ambulatory Visit: Payer: Medicare PPO | Admitting: Physician Assistant

## 2020-04-10 ENCOUNTER — Encounter: Payer: Self-pay | Admitting: Physician Assistant

## 2020-04-10 VITALS — BP 118/76 | HR 91 | Temp 97.2°F | Resp 16 | Ht 62.0 in | Wt 106.0 lb

## 2020-04-10 DIAGNOSIS — N3001 Acute cystitis with hematuria: Secondary | ICD-10-CM | POA: Diagnosis not present

## 2020-04-10 DIAGNOSIS — R112 Nausea with vomiting, unspecified: Secondary | ICD-10-CM

## 2020-04-10 LAB — POCT URINALYSIS DIPSTICK
Glucose, UA: NEGATIVE
Nitrite, UA: NEGATIVE
Protein, UA: POSITIVE — AB
Spec Grav, UA: 1.015 (ref 1.010–1.025)
Urobilinogen, UA: 1 E.U./dL
pH, UA: 6 (ref 5.0–8.0)

## 2020-04-10 MED ORDER — CIPROFLOXACIN HCL 250 MG PO TABS
250.0000 mg | ORAL_TABLET | Freq: Two times a day (BID) | ORAL | 0 refills | Status: DC
Start: 2020-04-10 — End: 2020-05-15

## 2020-04-10 NOTE — Assessment & Plan Note (Signed)
Urine culture pending rx for cipro

## 2020-04-10 NOTE — Assessment & Plan Note (Signed)
Will refer to Dr Lyda Jester per Dr Tobie Poet

## 2020-04-10 NOTE — Progress Notes (Signed)
Acute Office Visit  Subjective:    Patient ID: Alexandria Jones, female    DOB: 1941-09-30, 79 y.o.   MRN: [id number]  Chief Complaint  Patient presents with  . Urinary Tract Infection    HPI Patient is in today for UTI - accompanied by husband They state recently finished a round of antibiotics (amoxicillin) for UTI - urine culture however showed resistancy to ampicillin but pt did return for repeat ua / culture afterward which was normal and pt was feeling better Since last night pt with dysuria, urgency and frequency again  When asking history husband states that she has had intermittent nausea and vomiting 'for awhile', has lost 4 pounds since last visit, doesn't eat or drink much - was placed on omeprazole by Dr Tobie Poet and states that is not helping and wants to know what is going to be done next  Past Medical History:  Diagnosis Date  . Breast cancer ([place]) 1990   cured S/P radiation treatment & chemo  . GERD (gastroesophageal reflux disease)   . Memory difficulty 03/06/2018  . Migraines   . Osteoarthritis     Past Surgical History:  Procedure Laterality Date  . APPENDECTOMY    . BREAST LUMPECTOMY    . left lumpectomy  1990  . TUBAL LIGATION    . WRIST SURGERY      Family History  Problem Relation Age of Onset  . Dementia Mother   . Cancer Mother   . Dementia Father   . Depression Father   . Cancer Brother   . Cancer Sister     Social History   Socioeconomic History  . Marital status: Married    Spouse name: Not on file  . Number of children: Not on file  . Years of education: Masters- education  . Highest education level: Not on file  Occupational History  . Not on file  Tobacco Use  . Smoking status: Never Smoker  . Smokeless tobacco: Never Used  Substance and Sexual Activity  . Alcohol use: Yes    Comment: Rare- wine  . Drug use: Never  . Sexual activity: Not on file  Other Topics Concern  . Not on file  Social History Narrative   Lives  with husband   Caffeine use: sometimes tea   Right handed    Social Determinants of Health   Financial Resource Strain:   . Difficulty of Paying Living Expenses:   Food Insecurity:   . Worried About Charity fundraiser in the Last Year:   . Arboriculturist in the Last Year:   Transportation Needs:   . Film/video editor (Medical):   Marland Kitchen Lack of Transportation (Non-Medical):   Physical Activity:   . Days of Exercise per Week:   . Minutes of Exercise per Session:   Stress:   . Feeling of Stress :   Social Connections:   . Frequency of Communication with Friends and Family:   . Frequency of Social Gatherings with Friends and Family:   . Attends Religious Services:   . Active Member of Clubs or Organizations:   . Attends Archivist Meetings:   Marland Kitchen Marital Status:   Intimate Partner Violence:   . Fear of Current or Ex-Partner:   . Emotionally Abused:   Marland Kitchen Physically Abused:   . Sexually Abused:      Current Outpatient Medications:  .  acetaminophen (TYLENOL) 500 MG tablet, Take 1,000 mg by mouth every 6 (six) hours  as needed., Disp: , Rfl:  .  memantine (NAMENDA) 5 MG tablet, Take 1 tablet (5 mg total) by mouth 2 (two) times daily., Disp: 60 tablet, Rfl: 5 .  omeprazole (PRILOSEC) 20 MG capsule, Take 1 capsule (20 mg total) by mouth daily., Disp: 30 capsule, Rfl: 3 .  venlafaxine XR (EFFEXOR-XR) 37.5 MG 24 hr capsule, TAKE 1 CAPSULE BY MOUTH DAILY, Disp: 90 capsule, Rfl: 0 .  ciprofloxacin (CIPRO) 250 MG tablet, Take 1 tablet (250 mg total) by mouth 2 (two) times daily., Disp: 14 tablet, Rfl: 0   Allergies  Allergen Reactions  . Codeine Itching  . Morphine Nausea Only    unknown unknown   . Isosorbide Nitrate Other (See Comments)    unknown Other reaction(s): Other (See Comments) unknown     CONSTITUTIONAL: see HPI  CARDIOVASCULAR: Negative for chest pain, dizziness, palpitations and pedal edema.  RESPIRATORY: Negative for recent cough and dyspnea.    GASTROINTESTINAL: see HPI GU- see HPI INTEGUMENTARY: Negative for rash.          Objective:    PHYSICAL EXAM:   VS: BP 118/76   Pulse 91   Temp (!) 97.2 F (36.2 C)   Resp 16   Ht 5\' 2"  (1.575 m)   Wt 106 lb (48.1 kg)   SpO2 96%   BMI 19.39 kg/m   GEN: Well nourished, well developed, in no acute distress   Cardiac: RRR; no murmurs, rubs, or gallops,no edema - no significant varicosities Respiratory:  normal respiratory rate and pattern with no distress - normal breath sounds with no rales, rhonchi, wheezes or rubs  Skin: warm and dry, no rash  Neuro:  Alert and Oriented x 3, Strength and sensation are intact - CN II-Xii grossly intact Psych: euthymic mood, appropriate affect and demeanor Office Visit on 04/10/2020  Component Date Value Ref Range Status  . Color, UA 04/10/2020 yellow   Final  . Clarity, UA 04/10/2020 cloudy   Final  . Glucose, UA 04/10/2020 Negative  Negative Final  . Bilirubin, UA 04/10/2020 1+   Final  . Ketones, UA 04/10/2020 trace   Final  . Spec Grav, UA 04/10/2020 1.015  1.010 - 1.025 Final  . Blood, UA 04/10/2020 1+   Final  . pH, UA 04/10/2020 6.0  5.0 - 8.0 Final  . Protein, UA 04/10/2020 Positive* Negative Final  . Urobilinogen, UA 04/10/2020 1.0  0.2 or 1.0 E.U./dL Final  . Nitrite, UA 04/10/2020 neg   Final  . Leukocytes, UA 04/10/2020 Large (3+)* Negative Final     Wt Readings from Last 3 Encounters:  04/10/20 106 lb (48.1 kg)  03/25/20 110 lb 3.2 oz (50 kg)  03/17/20 111 lb (50.3 kg)    Health Maintenance Due  Topic Date Due  . COVID-19 Vaccine (1) Never done  . MAMMOGRAM  12/26/2018  . DEXA SCAN  12/27/2019    There are no preventive care reminders to display for this patient.        Assessment & Plan:   Problem List Items Addressed This Visit      Digestive   Non-intractable vomiting    Will refer to Dr Lyda Jester per Dr Tobie Poet      Relevant Orders   Ambulatory referral to Gastroenterology     Genitourinary    Acute cystitis with hematuria - Primary    Urine culture pending rx for cipro      Relevant Orders   POCT urinalysis dipstick (Completed)   Urine Culture  Meds ordered this encounter  Medications  . ciprofloxacin (CIPRO) 250 MG tablet    Sig: Take 1 tablet (250 mg total) by mouth 2 (two) times daily.    Dispense:  14 tablet    Refill:  0    Order Specific Question:   Supervising Provider    Answer:   COX, KIRSTEN [id number]     [place], PA-C

## 2020-04-11 DIAGNOSIS — M810 Age-related osteoporosis without current pathological fracture: Secondary | ICD-10-CM | POA: Diagnosis not present

## 2020-04-11 DIAGNOSIS — Z853 Personal history of malignant neoplasm of breast: Secondary | ICD-10-CM | POA: Diagnosis not present

## 2020-04-11 DIAGNOSIS — Z1231 Encounter for screening mammogram for malignant neoplasm of breast: Secondary | ICD-10-CM | POA: Diagnosis not present

## 2020-04-11 DIAGNOSIS — M85851 Other specified disorders of bone density and structure, right thigh: Secondary | ICD-10-CM | POA: Diagnosis not present

## 2020-04-12 LAB — URINE CULTURE

## 2020-04-25 ENCOUNTER — Ambulatory Visit (INDEPENDENT_AMBULATORY_CARE_PROVIDER_SITE_OTHER): Payer: Medicare PPO

## 2020-04-25 ENCOUNTER — Other Ambulatory Visit: Payer: Self-pay

## 2020-04-25 DIAGNOSIS — N3001 Acute cystitis with hematuria: Secondary | ICD-10-CM

## 2020-04-25 LAB — POCT URINALYSIS DIP (CLINITEK)
Bilirubin, UA: NEGATIVE
Blood, UA: NEGATIVE
Glucose, UA: NEGATIVE mg/dL
Ketones, POC UA: NEGATIVE mg/dL
Nitrite, UA: NEGATIVE
Spec Grav, UA: 1.025 (ref 1.010–1.025)
Urobilinogen, UA: 0.2 E.U./dL
pH, UA: 6 (ref 5.0–8.0)

## 2020-04-25 NOTE — Progress Notes (Signed)
      Patient came in today for a UA.  She has completed her Cipro prescribed on 04/10/20 and denies any symptoms.   Component     Latest Ref Rng & Units 03/25/2020 04/10/2020 04/25/2020          10:04 AM  Color, UA     yellow clear yellow yellow  Clarity, UA     clear  cloudy clear  Glucose     negative mg/dL Negative Negative negative  Bilirubin, UA     negative Negative 1+ negative  Specific Gravity, UA     1.010 - 1.025 1.015 1.015 1.025  RBC, UA     negative Negative 1+ negative  pH, UA     5.0 - 8.0 6.0 6.0 6.0  POC PROTEIN,UA     negative, trace   trace  Urobilinogen, UA     0.2 or 1.0 E.U./dL 0.2 1.0 0.2  Nitrite, UA     Negative Negative neg Negative  Leukocytes,UA     Negative Negative Large (3+) (A) Small (1+) (A)    Shelle Iron, LPN [id number] AM

## 2020-04-26 LAB — URINE CULTURE

## 2020-05-15 ENCOUNTER — Other Ambulatory Visit: Payer: Self-pay

## 2020-05-15 ENCOUNTER — Ambulatory Visit: Payer: Medicare PPO | Admitting: Family Medicine

## 2020-05-15 ENCOUNTER — Encounter: Payer: Self-pay | Admitting: Family Medicine

## 2020-05-15 VITALS — BP 130/82 | HR 74 | Temp 97.5°F | Ht 63.0 in | Wt 105.0 lb

## 2020-05-15 DIAGNOSIS — F0280 Dementia in other diseases classified elsewhere without behavioral disturbance: Secondary | ICD-10-CM | POA: Diagnosis not present

## 2020-05-15 DIAGNOSIS — G301 Alzheimer's disease with late onset: Secondary | ICD-10-CM

## 2020-05-15 DIAGNOSIS — R300 Dysuria: Secondary | ICD-10-CM

## 2020-05-15 DIAGNOSIS — N952 Postmenopausal atrophic vaginitis: Secondary | ICD-10-CM | POA: Insufficient documentation

## 2020-05-15 DIAGNOSIS — R112 Nausea with vomiting, unspecified: Secondary | ICD-10-CM | POA: Diagnosis not present

## 2020-05-15 LAB — POCT URINALYSIS DIPSTICK
Blood, UA: NEGATIVE
Glucose, UA: NEGATIVE
Ketones, UA: NEGATIVE
Leukocytes, UA: NEGATIVE
Nitrite, UA: NEGATIVE
Protein, UA: POSITIVE — AB
Spec Grav, UA: 1.02 (ref 1.010–1.025)
Urobilinogen, UA: 0.2 E.U./dL
pH, UA: 6 (ref 5.0–8.0)

## 2020-05-15 MED ORDER — MEMANTINE HCL 10 MG PO TABS: 10.0000 mg | ORAL_TABLET | Freq: Two times a day (BID) | ORAL | 1 refills | Status: DC

## 2020-05-15 NOTE — Progress Notes (Signed)
Acute Office Visit  Subjective:    Patient ID: Alexandria Jones, female    DOB: 1941/05/30, 79 y.o.   MRN: [id number]  Chief Complaint  Patient presents with  . Urinary Tract Infection    started yesterday    HPI Patient is in today for UTI symptoms. Patient is having dysuria and polyuria. Vomited on Friday after eating. Denies abdominal pain. She had some vomiting 3-4 weeks. Last time she vomited was may 9th prior to Friday. Bowels normal. Headaches some.  Past Medical History:  Diagnosis Date  . Breast cancer ([place]) 1990   cured S/P radiation treatment & chemo  . GERD (gastroesophageal reflux disease)   . Memory difficulty 03/06/2018  . Migraines   . Osteoarthritis     Past Surgical History:  Procedure Laterality Date  . APPENDECTOMY    . BREAST LUMPECTOMY    . left lumpectomy  1990  . TUBAL LIGATION    . WRIST SURGERY      Family History  Problem Relation Age of Onset  . Dementia Mother   . Cancer Mother   . Dementia Father   . Depression Father   . Cancer Brother   . Cancer Sister     Social History   Socioeconomic History  . Marital status: Married    Spouse name: Not on file  . Number of children: Not on file  . Years of education: Masters- education  . Highest education level: Not on file  Occupational History  . Not on file  Tobacco Use  . Smoking status: Never Smoker  . Smokeless tobacco: Never Used  Substance and Sexual Activity  . Alcohol use: Yes    Comment: Rare- wine  . Drug use: Never  . Sexual activity: Not on file  Other Topics Concern  . Not on file  Social History Narrative   Lives with husband   Caffeine use: sometimes tea   Right handed    Social Determinants of Health   Financial Resource Strain:   . Difficulty of Paying Living Expenses:   Food Insecurity:   . Worried About Charity fundraiser in the Last Year:   . Arboriculturist in the Last Year:   Transportation Needs:   . Film/video editor (Medical):   Marland Kitchen  Lack of Transportation (Non-Medical):   Physical Activity:   . Days of Exercise per Week:   . Minutes of Exercise per Session:   Stress:   . Feeling of Stress :   Social Connections:   . Frequency of Communication with Friends and Family:   . Frequency of Social Gatherings with Friends and Family:   . Attends Religious Services:   . Active Member of Clubs or Organizations:   . Attends Archivist Meetings:   Marland Kitchen Marital Status:   Intimate Partner Violence:   . Fear of Current or Ex-Partner:   . Emotionally Abused:   Marland Kitchen Physically Abused:   . Sexually Abused:     Outpatient Medications Prior to Visit  Medication Sig Dispense Refill  . acetaminophen (TYLENOL) 500 MG tablet Take 1,000 mg by mouth every 6 (six) hours as needed.    Marland Kitchen omeprazole (PRILOSEC) 20 MG capsule Take 1 capsule (20 mg total) by mouth daily. 30 capsule 3  . venlafaxine XR (EFFEXOR-XR) 37.5 MG 24 hr capsule TAKE 1 CAPSULE BY MOUTH DAILY 90 capsule 0  . ciprofloxacin (CIPRO) 250 MG tablet Take 1 tablet (250 mg total) by mouth 2 (two)  times daily. 14 tablet 0  . memantine (NAMENDA) 5 MG tablet Take 1 tablet (5 mg total) by mouth 2 (two) times daily. (Patient not taking: Reported on 05/15/2020) 60 tablet 5   No facility-administered medications prior to visit.    Allergies  Allergen Reactions  . Codeine Itching  . Morphine Nausea Only    unknown unknown   . Isosorbide Nitrate Other (See Comments)    unknown Other reaction(s): Other (See Comments) unknown     Review of Systems  Constitutional: Negative for chills, fatigue and fever.  HENT: Negative for congestion, ear pain, rhinorrhea and sore throat.   Respiratory: Negative for cough and shortness of breath.   Cardiovascular: Negative for chest pain.  Gastrointestinal: Positive for nausea and vomiting. Negative for abdominal pain and constipation.  Genitourinary: Positive for dysuria and frequency. Negative for urgency.  Musculoskeletal: Negative  for back pain.       Objective:    Physical Exam Vitals reviewed.  Constitutional:      Appearance: Normal appearance. She is normal weight.  Cardiovascular:     Rate and Rhythm: Normal rate and regular rhythm.     Pulses: Normal pulses.     Heart sounds: Normal heart sounds.  Pulmonary:     Effort: Pulmonary effort is normal. No respiratory distress.     Breath sounds: Normal breath sounds.  Abdominal:     General: Abdomen is flat. Bowel sounds are normal.     Palpations: Abdomen is soft.     Tenderness: There is no abdominal tenderness. There is no guarding or rebound.     Comments: Questionable Murphy sign  Neurological:     Mental Status: She is alert and oriented to person, place, and time.  Psychiatric:        Mood and Affect: Mood normal.        Behavior: Behavior normal.     BP 130/82   Pulse 74   Temp (!) 97.5 F (36.4 C)   Ht 5\' 3"  (1.6 m)   Wt 105 lb (47.6 kg)   SpO2 99%   BMI 18.60 kg/m  Wt Readings from Last 3 Encounters:  05/15/20 105 lb (47.6 kg)  04/10/20 106 lb (48.1 kg)  03/25/20 110 lb 3.2 oz (50 kg)    Health Maintenance Due  Topic Date Due  . COVID-19 Vaccine (1) Never done  . MAMMOGRAM  12/26/2018  . DEXA SCAN  12/27/2019    There are no preventive care reminders to display for this patient.   No results found for: TSH Lab Results  Component Value Date   WBC 9.4 01/28/2020   HGB 15.6 01/28/2020   HCT 45.9 01/28/2020   MCV 89 01/28/2020   PLT 316 01/28/2020   Lab Results  Component Value Date   NA 140 01/28/2020   K 4.7 01/28/2020   GLUCOSE 99 01/28/2020   BUN 11 01/28/2020   CREATININE 0.81 01/28/2020   BILITOT 0.4 01/28/2020   ALKPHOS 97 01/28/2020   AST 17 01/28/2020   PROT 6.9 01/28/2020   ALBUMIN 4.5 01/28/2020   CALCIUM 9.8 01/28/2020   No results found for: CHOL No results found for: HDL No results found for: LDLCALC No results found for: TRIG No results found for: CHOLHDL No results found for: HGBA1C       Assessment & Plan:  1. Dysuria likely secondary to atrophic vaginitis - POCT urinalysis dipstick.  Normal.  2. Atrophic vaginitis Premarin cream once daily applied to the external  vagina and around the urethra.  Samples given.  If helps patient to call back for a prescription.  3. Non-intractable vomiting with nausea, unspecified vomiting type Recommend low-fat diet.  I am concerned it may be her gallbladder versus reflux.  Patient is currently on omeprazole.  4. Late onset Alzheimer's disease without behavioral disturbance (HCC) Increase Namenda to 10 mg once twice daily.  Prescription sent.    Orders Placed This Encounter  Procedures  . POCT urinalysis dipstick     Follow-up: Return in about 3 months (around 08/04/2020).  An After Visit Summary was printed and given to the patient.  Rochel Brome [facility] [phone]

## 2020-05-15 NOTE — Patient Instructions (Addendum)
For Burning with urination (no bladder infection) apply to premarin cream to external vagina. Vomiting: I am concerned it is either reflux or her gallbladder.  Decrease fried chicken and Cheese burgers. Consider Gallbladder Ultrasound if no improvement.   Atrophic Vaginitis Atrophic vaginitis is a condition in which the tissues that line the vagina become dry and thin. This condition occurs in women who have stopped having their period. It is caused by a drop in a female hormone (estrogen). This hormone helps:  To keep the vagina moist.  To make a clear fluid. This clear fluid helps: ? To make the vagina ready for sex. ? To protect the vagina from infection. If the lining of the vagina is dry and thin, it may cause irritation, burning, or itchiness. It may also:  Make sex painful.  Make an exam of your vagina painful.  Cause bleeding.  Make you lose interest in sex.  Cause a burning feeling when you pee (urinate).  Cause a brown or yellow fluid to come from your vagina. Some women do not have symptoms. Follow these instructions at home: Medicines  Take over-the-counter and prescription medicines only as told by your doctor.  Do not use herbs or other medicines unless your doctor says it is okay.  Use medicines for for dryness. These include: ? Oils to make the vagina soft. ? Creams. ? Moisturizers. General instructions  Do not douche.  Do not use products that can make your vagina dry. These include: ? Scented sprays. ? Scented tampons. ? Scented soaps.  Sex can help increase blood flow and soften the tissue in the vagina. If it hurts to have sex: ? Tell your partner. ? Use products to make sex more comfortable. Use these only as told by your doctor. Contact a doctor if you:  Have discharge from the vagina that is different than usual.  Have a bad smell coming from your vagina.  Have new symptoms.  Do not get better.  Get worse. Summary  Atrophic  vaginitis is a condition in which the lining of the vagina becomes dry and thin.  This condition affects women who have stopped having their periods.  Treatment may include using products that help make the vagina soft.  Call a doctor if do not get better with treatment. This information is not intended to replace advice given to you by your health care provider. Make sure you discuss any questions you have with your health care provider. Document Revised: 12/12/2017 Document Reviewed: 12/12/2017 Elsevier Patient Education  2020 [place].   Gallbladder Eating Plan If you have a gallbladder condition, you may have trouble digesting fats. Eating a low-fat diet can help reduce your symptoms, and may be helpful before and after having surgery to remove your gallbladder (cholecystectomy). Your health care provider may recommend that you work with a diet and nutrition specialist (dietitian) to help you reduce the amount of fat in your diet. What are tips for following this plan? General guidelines  Limit your fat intake to less than 30% of your total daily calories. If you eat around 1,800 calories each day, this is less than 60 grams (g) of fat per day.  Fat is an important part of a healthy diet. Eating a low-fat diet can make it hard to maintain a healthy body weight. Ask your dietitian how much fat, calories, and other nutrients you need each day.  Eat small, frequent meals throughout the day instead of three large meals.  Drink at least  8-10 cups of fluid a day. Drink enough fluid to keep your urine clear or pale yellow.  Limit alcohol intake to no more than 1 drink a day for nonpregnant women and 2 drinks a day for men. One drink equals 12 oz of beer, 5 oz of wine, or 1 oz of hard liquor. Reading food labels  Check Nutrition Facts on food labels for the amount of fat per serving. Choose foods with less than 3 grams of fat per serving. Shopping  Choose nonfat and low-fat healthy  foods. Look for the words "nonfat," "low fat," or "fat free."  Avoid buying processed or prepackaged foods. Cooking  Cook using low-fat methods, such as baking, broiling, grilling, or boiling.  Cook with small amounts of healthy fats, such as olive oil, grapeseed oil, canola oil, or sunflower oil. What foods are recommended?   All fresh, frozen, or canned fruits and vegetables.  Whole grains.  Low-fat or non-fat (skim) milk and yogurt.  Lean meat, skinless poultry, fish, eggs, and beans.  Low-fat protein supplement powders or drinks.  Spices and herbs. What foods are not recommended?  High-fat foods. These include baked goods, fast food, fatty cuts of meat, ice cream, french toast, sweet rolls, pizza, cheese bread, foods covered with butter, creamy sauces, or cheese.  Fried foods. These include french fries, tempura, battered fish, breaded chicken, fried breads, and sweets.  Foods with strong odors.  Foods that cause bloating and gas. Summary  A low-fat diet can be helpful if you have a gallbladder condition, or before and after gallbladder surgery.  Limit your fat intake to less than 30% of your total daily calories. This is about 60 g of fat if you eat 1,800 calories each day.  Eat small, frequent meals throughout the day instead of three large meals. This information is not intended to replace advice given to you by your health care provider. Make sure you discuss any questions you have with your health care provider. Document Revised: 03/22/2019 Document Reviewed: 01/06/2017 Elsevier Patient Education  [place].

## 2020-05-28 ENCOUNTER — Other Ambulatory Visit: Payer: Self-pay | Admitting: Physician Assistant

## 2020-05-28 ENCOUNTER — Other Ambulatory Visit: Payer: Self-pay

## 2020-05-28 DIAGNOSIS — R112 Nausea with vomiting, unspecified: Secondary | ICD-10-CM

## 2020-05-28 MED ORDER — VENLAFAXINE HCL ER 37.5 MG PO CP24: 37.5000 mg | ORAL_CAPSULE | Freq: Every day | ORAL | 0 refills | Status: DC

## 2020-05-28 MED ORDER — OMEPRAZOLE 20 MG PO CPDR: 20.0000 mg | DELAYED_RELEASE_CAPSULE | Freq: Every day | ORAL | 3 refills | Status: DC

## 2020-06-23 ENCOUNTER — Ambulatory Visit: Payer: Medicare Other | Admitting: Family Medicine

## 2020-07-14 NOTE — Progress Notes (Signed)
Subjective:  Patient ID: Alexandria Jones, female    DOB: Jun 14, 1941  Age: 79 y.o. MRN: [id number]  Chief Complaint  Patient presents with  . Gastroesophageal Reflux  . Depression    HPI  Alexandria Jones presents with major depressive disorder, single episode, mild.  Presently, she denies any depressive symptoms.  Current medications include Effexor.      Alexandria Jones presents with a diagnosis of gastro-esophageal reflux disease without esophagitis.  The course has been stable and nonprogressive.  Alexandria Jones is better. She wishes to stop prilosec.   Patient tri[place] on [place] at the beach last [place] and fell scraping right wrist and left leg. Complaining of right wrist pain.  on deck at the beach last week and fell scraping right wrist and left leg. Complaining of right wrist pain.  Dementia unchanged. Currently on Namenda. Patient does not feel her memory is bad, but her husband disagrees.   Current Outpatient Medications on File Prior to Visit  Medication Sig Dispense Refill  . acetaminophen (TYLENOL) 500 MG tablet Take 1,000 mg by mouth every 6 (six) hours as needed.    . memantine (NAMENDA) 10 MG tablet Take 1 tablet (10 mg total) by mouth 2 (two) times daily. 180 tablet 1  . omeprazole (PRILOSEC) 20 MG capsule Take 1 capsule (20 mg total) by mouth daily. 30 capsule 3  . venlafaxine XR (EFFEXOR-XR) 37.5 MG 24 hr capsule Take 1 capsule (37.5 mg total) by mouth daily. 90 capsule 0   No current facility-administered medications on file prior to visit.   Past Medical History:  Diagnosis Date  . Breast cancer ([place]) 1990   cured S/P radiation treatment & chemo  . GERD (gastroesophageal reflux disease)   . Memory difficulty 03/06/2018  . Migraines   . Osteoarthritis    Past Surgical History:  Procedure Laterality Date  . APPENDECTOMY    . BREAST LUMPECTOMY    . left lumpectomy  1990  . TUBAL LIGATION    . WRIST SURGERY      Family History  Problem Relation Age of Onset  . Dementia  Mother   . Cancer Mother   . Dementia Father   . Depression Father   . Cancer Brother   . Cancer Sister    Social History   Socioeconomic History  . Marital status: Married    Spouse name: Not on file  . Number of children: Not on file  . Years of education: Masters- education  . Highest education level: Not on file  Occupational History  . Not on file  Tobacco Use  . Smoking status: Never Smoker  . Smokeless tobacco: Never Used  Vaping Use  . Vaping Use: Never used  Substance and Sexual Activity  . Alcohol use: Yes    Comment: Rare- wine  . Drug use: Never  . Sexual activity: Not on file  Other Topics Concern  . Not on file  Social History Narrative   Lives with husband   Caffeine use: sometimes tea   Right handed    Social Determinants of Health   Financial Resource Strain:   . Difficulty of Paying Living Expenses:   Food Insecurity:   . Worried About Charity fundraiser in the Last Year:   . Arboriculturist in the Last Year:   Transportation Needs:   . Film/video editor (Medical):   Marland Kitchen Lack of Transportation (Non-Medical):   Physical Activity:   . Days of Exercise per Week:   .  Minutes of Exercise per Session:   Stress:   . Feeling of Stress :   Social Connections:   . Frequency of Communication with Friends and Family:   . Frequency of Social Gatherings with Friends and Family:   . Attends Religious Services:   . Active Member of Clubs or Organizations:   . Attends Archivist Meetings:   Marland Kitchen Marital Status:     Review of Systems  Constitutional: Positive for fatigue. Negative for chills and fever.  HENT: Negative for congestion, rhinorrhea and sore throat.   Respiratory: Negative for cough and shortness of breath.   Cardiovascular: Negative for chest pain.  Gastrointestinal: Positive for constipation and vomiting. Negative for abdominal pain, diarrhea and nausea.  Genitourinary: Negative for dysuria and urgency.  Musculoskeletal:  Positive for myalgias. Negative for back pain.  Skin: Positive for wound (skin tear right wrist, and left lower leg. ).  Neurological: Positive for weakness and headaches (almost daily, relieved with Tylenol). Negative for dizziness and light-headedness.  Psychiatric/Behavioral: Negative for dysphoric mood. The patient is not nervous/anxious.      Objective:  BP 110/74   Pulse 84   Temp (!) 97.5 F (36.4 C)   Resp 16   Ht 5\' 3"  (1.6 m)   Wt 106 lb (48.1 kg)   BMI 18.78 kg/m   BP/Weight 07/15/2020 05/15/2020 2/29/7989  Systolic BP 211 941 740  Diastolic BP 74 82 76  Wt. (Lbs) 106 105 106  BMI 18.78 18.6 19.39    Physical Exam Vitals reviewed.  Constitutional:      Appearance: Normal appearance. She is normal weight.  Cardiovascular:     Rate and Rhythm: Normal rate and regular rhythm.     Pulses: Normal pulses.     Heart sounds: Normal heart sounds.  Pulmonary:     Effort: Pulmonary effort is normal. No respiratory distress.     Breath sounds: Normal breath sounds.  Abdominal:     General: Abdomen is flat. Bowel sounds are normal.     Palpations: Abdomen is soft.     Tenderness: There is no abdominal tenderness.  Skin:    Comments: Abrasion rt wrist. - extra skin removed.  Deformity of right wrist from previous fracture.  Area is bruised.  Left lower leg has small abrasion.  No erythema or drainage.   Neurological:     Mental Status: She is alert and oriented to person, place, and time.  Psychiatric:        Mood and Affect: Mood normal.        Behavior: Behavior normal.     Diabetic Foot Exam - Simple   No data filed       Lab Results  Component Value Date   WBC 9.4 01/28/2020   HGB 15.6 01/28/2020   HCT 45.9 01/28/2020   PLT 316 01/28/2020   GLUCOSE 99 01/28/2020   AST 17 01/28/2020   NA 140 01/28/2020   K 4.7 01/28/2020   CL 101 01/28/2020   CREATININE 0.81 01/28/2020   BUN 11 01/28/2020      Assessment & Plan:   1. Gastroesophageal reflux  disease without esophagitis  D/C omeprazole  2. Late onset Alzheimer's disease with behavioral disturbance [facility])  It is unclear how effective treatment is, but will continue.   3. Right wrist pain - DG Wrist Complete Right; Future  4. Abrasion of wrist, initial encounter and left lower leg, initial encounter.  Rebandaged. Continue antibiotic ointment daily with nonstick dressing.  5. Fall on same level, initial encounter   Discussed appropriate shoes  Orders Placed This Encounter  Procedures  . DG Wrist Complete Right     Follow-up: Return in about 6 months (around 01/15/2021).  An After Visit Summary was printed and given to the patient.  Rochel Brome [facility] [phone]

## 2020-07-15 ENCOUNTER — Other Ambulatory Visit: Payer: Self-pay

## 2020-07-15 ENCOUNTER — Encounter: Payer: Self-pay | Admitting: Family Medicine

## 2020-07-15 ENCOUNTER — Ambulatory Visit: Payer: Medicare PPO | Admitting: Family Medicine

## 2020-07-15 VITALS — BP 110/74 | HR 84 | Temp 97.5°F | Resp 16 | Ht 63.0 in | Wt 106.0 lb

## 2020-07-15 DIAGNOSIS — W1830XA Fall on same level, unspecified, initial encounter: Secondary | ICD-10-CM

## 2020-07-15 DIAGNOSIS — G301 Alzheimer's disease with late onset: Secondary | ICD-10-CM | POA: Diagnosis not present

## 2020-07-15 DIAGNOSIS — F02818 Dementia in other diseases classified elsewhere, unspecified severity, with other behavioral disturbance: Secondary | ICD-10-CM

## 2020-07-15 DIAGNOSIS — S60819A Abrasion of unspecified wrist, initial encounter: Secondary | ICD-10-CM | POA: Diagnosis not present

## 2020-07-15 DIAGNOSIS — F0280 Dementia in other diseases classified elsewhere without behavioral disturbance: Secondary | ICD-10-CM

## 2020-07-15 DIAGNOSIS — M25531 Pain in right wrist: Secondary | ICD-10-CM

## 2020-07-15 DIAGNOSIS — K219 Gastro-esophageal reflux disease without esophagitis: Secondary | ICD-10-CM

## 2020-07-15 DIAGNOSIS — F0281 Dementia in other diseases classified elsewhere with behavioral disturbance: Secondary | ICD-10-CM | POA: Diagnosis not present

## 2020-07-20 ENCOUNTER — Encounter: Payer: Self-pay | Admitting: Family Medicine

## 2020-08-25 ENCOUNTER — Other Ambulatory Visit: Payer: Self-pay

## 2020-08-25 ENCOUNTER — Ambulatory Visit (INDEPENDENT_AMBULATORY_CARE_PROVIDER_SITE_OTHER): Payer: Medicare PPO

## 2020-08-25 DIAGNOSIS — Z23 Encounter for immunization: Secondary | ICD-10-CM | POA: Diagnosis not present

## 2020-08-25 NOTE — Progress Notes (Signed)
    Patient Name: Alexandria Jones Patient DOB: [birth year]  08/25/2020  Alexandria Jones came in today for her 2021-2022 influenza vaccine.  Patient tolerated it well.   Erie Noe

## 2020-08-26 ENCOUNTER — Other Ambulatory Visit: Payer: Self-pay | Admitting: Physician Assistant

## 2020-11-07 ENCOUNTER — Other Ambulatory Visit: Payer: Self-pay | Admitting: Family Medicine

## 2020-11-18 ENCOUNTER — Encounter: Payer: Self-pay | Admitting: Nurse Practitioner

## 2020-11-18 ENCOUNTER — Other Ambulatory Visit: Payer: Self-pay

## 2020-11-18 ENCOUNTER — Ambulatory Visit: Payer: Medicare PPO | Admitting: Nurse Practitioner

## 2020-11-18 VITALS — BP 118/86 | HR 86 | Temp 97.2°F | Ht 63.0 in | Wt 107.6 lb

## 2020-11-18 DIAGNOSIS — G301 Alzheimer's disease with late onset: Secondary | ICD-10-CM

## 2020-11-18 DIAGNOSIS — K5900 Constipation, unspecified: Secondary | ICD-10-CM

## 2020-11-18 DIAGNOSIS — F0280 Dementia in other diseases classified elsewhere without behavioral disturbance: Secondary | ICD-10-CM | POA: Diagnosis not present

## 2020-11-18 DIAGNOSIS — N3001 Acute cystitis with hematuria: Secondary | ICD-10-CM | POA: Diagnosis not present

## 2020-11-18 DIAGNOSIS — R5382 Chronic fatigue, unspecified: Secondary | ICD-10-CM

## 2020-11-18 LAB — CBC WITH DIFFERENTIAL/PLATELET
Basophils Absolute: 0.1 10*3/uL (ref 0.0–0.2)
Basos: 1 %
EOS (ABSOLUTE): 0.1 10*3/uL (ref 0.0–0.4)
Eos: 2 %
Hematocrit: 40.7 % (ref 34.0–46.6)
Hemoglobin: 14.1 g/dL (ref 11.1–15.9)
Immature Grans (Abs): 0 10*3/uL (ref 0.0–0.1)
Immature Granulocytes: 0 %
Lymphocytes Absolute: 1 10*3/uL (ref 0.7–3.1)
Lymphs: 20 %
MCH: 30.9 pg (ref 26.6–33.0)
MCHC: 34.6 g/dL (ref 31.5–35.7)
MCV: 89 fL (ref 79–97)
Monocytes Absolute: 0.6 10*3/uL (ref 0.1–0.9)
Monocytes: 12 %
Neutrophils Absolute: 3.2 10*3/uL (ref 1.4–7.0)
Neutrophils: 65 %
Platelets: 277 10*3/uL (ref 150–450)
RBC: 4.57 x10E6/uL (ref 3.77–5.28)
RDW: 12 % (ref 11.7–15.4)
WBC: 5 10*3/uL (ref 3.4–10.8)

## 2020-11-18 LAB — COMPREHENSIVE METABOLIC PANEL
ALT: 12 IU/L (ref 0–32)
AST: 20 IU/L (ref 0–40)
Albumin/Globulin Ratio: 2.4 — ABNORMAL HIGH (ref 1.2–2.2)
Albumin: 4.5 g/dL (ref 3.6–4.6)
Alkaline Phosphatase: 70 IU/L (ref 44–121)
BUN/Creatinine Ratio: 11 — ABNORMAL LOW (ref 12–28)
BUN: 10 mg/dL (ref 8–27)
Bilirubin Total: 0.4 mg/dL (ref 0.0–1.2)
CO2: 23 mmol/L (ref 20–29)
Calcium: 9.5 mg/dL (ref 8.7–10.3)
Chloride: 105 mmol/L (ref 96–106)
Creatinine, Ser: 0.87 mg/dL (ref 0.57–1.00)
GFR calc Af Amer: 72 mL/min/{1.73_m2} (ref >59)
GFR calc non Af Amer: 63 mL/min/{1.73_m2} (ref >59)
Globulin, Total: 1.9 g/dL (ref 1.5–4.5)
Glucose: 77 mg/dL (ref 65–99)
Potassium: 4.9 mmol/L (ref 3.5–5.2)
Sodium: 142 mmol/L (ref 134–144)
Total Protein: 6.4 g/dL (ref 6.0–8.5)

## 2020-11-18 LAB — POCT URINALYSIS DIPSTICK
Bilirubin, UA: NEGATIVE
Glucose, UA: NEGATIVE
Ketones, UA: NEGATIVE
Nitrite, UA: POSITIVE
Protein, UA: NEGATIVE
Spec Grav, UA: 1.02 (ref 1.010–1.025)
Urobilinogen, UA: NEGATIVE E.U./dL — AB
pH, UA: 6 (ref 5.0–8.0)

## 2020-11-18 LAB — TSH: TSH: 1.76 u[IU]/mL (ref 0.450–4.500)

## 2020-11-18 MED ORDER — NITROFURANTOIN MONOHYD MACRO 100 MG PO CAPS: 100.0000 mg | ORAL_CAPSULE | Freq: Two times a day (BID) | ORAL | 0 refills | Status: DC

## 2020-11-18 NOTE — Progress Notes (Signed)
Acute Office Visit  Subjective:    Patient ID: Alexandria Jones, female    DOB: Jan 15, 1941, 79 y.o.   MRN: [id number]  Chief Complaint  Patient presents with  . Urinary Tract Infection    Frequency and burning    HPI Patient is in today for dysuria. Onset of symptoms began 3-days ago. She denies fever, chills, back pain, frequency, or urgency.  Pt has a history of dementia. Spouse is present, supplementing history. Pt states she has experienced fatigue and increased constipation recently. She has not taken any treatments for constipation. Past medical history includes GERD, osteoporosis, and osteoarthritis.   Past Medical History:  Diagnosis Date  . Breast cancer ([place]) 1990   cured S/P radiation treatment & chemo  . GERD (gastroesophageal reflux disease)   . Memory difficulty 03/06/2018  . Migraines   . Osteoarthritis     Past Surgical History:  Procedure Laterality Date  . APPENDECTOMY    . BREAST LUMPECTOMY    . left lumpectomy  1990  . TUBAL LIGATION    . WRIST SURGERY      Family History  Problem Relation Age of Onset  . Dementia Mother   . Cancer Mother   . Dementia Father   . Depression Father   . Cancer Brother   . Cancer Sister     Social History   Socioeconomic History  . Marital status: Married    Spouse name: Not on file  . Number of children: Not on file  . Years of education: Masters- education  . Highest education level: Not on file  Occupational History  . Not on file  Tobacco Use  . Smoking status: Never Smoker  . Smokeless tobacco: Never Used  Vaping Use  . Vaping Use: Never used  Substance and Sexual Activity  . Alcohol use: Yes    Comment: Rare- wine  . Drug use: Never  . Sexual activity: Not on file  Other Topics Concern  . Not on file  Social History Narrative   Lives with husband   Caffeine use: sometimes tea   Right handed    Social Determinants of Health   Financial Resource Strain:   . Difficulty of Paying Living  Expenses: Not on file  Food Insecurity:   . Worried About Charity fundraiser in the Last Year: Not on file  . Ran Out of Food in the Last Year: Not on file  Transportation Needs:   . Lack of Transportation (Medical): Not on file  . Lack of Transportation (Non-Medical): Not on file  Physical Activity:   . Days of Exercise per Week: Not on file  . Minutes of Exercise per Session: Not on file  Stress:   . Feeling of Stress : Not on file  Social Connections:   . Frequency of Communication with Friends and Family: Not on file  . Frequency of Social Gatherings with Friends and Family: Not on file  . Attends Religious Services: Not on file  . Active Member of Clubs or Organizations: Not on file  . Attends Archivist Meetings: Not on file  . Marital Status: Not on file  Intimate Partner Violence:   . Fear of Current or Ex-Partner: Not on file  . Emotionally Abused: Not on file  . Physically Abused: Not on file  . Sexually Abused: Not on file    Outpatient Medications Prior to Visit  Medication Sig Dispense Refill  . acetaminophen (TYLENOL) 500 MG tablet Take 1,000 mg  by mouth every 6 (six) hours as needed.    . memantine (NAMENDA) 10 MG tablet TAKE 1 TABLET(10 MG) BY MOUTH TWICE DAILY 180 tablet 1  . venlafaxine XR (EFFEXOR-XR) 37.5 MG 24 hr capsule TAKE 1 CAPSULE(37.5 MG) BY MOUTH DAILY 90 capsule 0  . omeprazole (PRILOSEC) 20 MG capsule Take 1 capsule (20 mg total) by mouth daily. 30 capsule 3   No facility-administered medications prior to visit.    Allergies  Allergen Reactions  . Codeine Itching  . Morphine Nausea Only    unknown unknown   . Isosorbide Nitrate Other (See Comments)    unknown Other reaction(s): Other (See Comments) unknown     Review of Systems  Constitutional: Positive for fatigue. Negative for fever.  HENT: Negative for congestion, ear pain, sinus pressure and sore throat.   Eyes: Negative for pain.  Respiratory: Negative for cough,  chest tightness, shortness of breath and wheezing.   Cardiovascular: Negative for chest pain and palpitations.  Gastrointestinal: Positive for constipation. Negative for abdominal pain, diarrhea, nausea and vomiting.  Genitourinary: Positive for dysuria. Negative for frequency, hematuria and urgency.  Musculoskeletal: Negative for arthralgias, back pain, joint swelling and myalgias.  Skin: Negative for rash.  Neurological: Negative for dizziness, weakness and headaches.  Psychiatric/Behavioral: Negative for dysphoric mood. The patient is not nervous/anxious.        History of dementia       Objective:    BP 118/86 (BP Location: Left Arm, Patient Position: Sitting)   Pulse 86   Temp (!) 97.2 F (36.2 C) (Temporal)   Ht 5\' 3"  (1.6 m)   Wt 107 lb 9.6 oz (48.8 kg)   SpO2 100%   BMI 19.06 kg/m    Physical Exam Constitutional:      Appearance: Normal appearance. She is normal weight.  HENT:     Nose: Nose normal.     Mouth/Throat:     Mouth: Mucous membranes are moist.  Cardiovascular:     Rate and Rhythm: Normal rate and regular rhythm.     Pulses: Normal pulses.     Heart sounds: Normal heart sounds.  Pulmonary:     Effort: Pulmonary effort is normal.     Breath sounds: Normal breath sounds.  Abdominal:     Palpations: Abdomen is soft.     Tenderness: There is no abdominal tenderness. There is no right CVA tenderness, left CVA tenderness or guarding.  Skin:    General: Skin is warm and dry.     Capillary Refill: Capillary refill takes less than 2 seconds.  Neurological:     Mental Status: She is alert and oriented to person, place, and time. Mental status is at baseline.  Psychiatric:        Mood and Affect: Mood normal.        Behavior: Behavior normal.        Thought Content: Thought content normal.        Judgment: Judgment normal.     BP 118/86 (BP Location: Left Arm, Patient Position: Sitting)   Pulse 86   Temp (!) 97.2 F (36.2 C) (Temporal)   Ht 5\' 3"  (1.6  m)   Wt 107 lb 9.6 oz (48.8 kg)   SpO2 100%   BMI 19.06 kg/m  Wt Readings from Last 3 Encounters:  11/18/20 107 lb 9.6 oz (48.8 kg)  07/15/20 106 lb (48.1 kg)  05/15/20 105 lb (47.6 kg)    Lab Results  Component Value Date  WBC 9.4 01/28/2020   HGB 15.6 01/28/2020   HCT 45.9 01/28/2020   MCV 89 01/28/2020   PLT 316 01/28/2020   Lab Results  Component Value Date   NA 140 01/28/2020   K 4.7 01/28/2020   GLUCOSE 99 01/28/2020   BUN 11 01/28/2020   CREATININE 0.81 01/28/2020   BILITOT 0.4 01/28/2020   ALKPHOS 97 01/28/2020   AST 17 01/28/2020   PROT 6.9 01/28/2020   ALBUMIN 4.5 01/28/2020   CALCIUM 9.8 01/28/2020        Assessment & Plan:    1. Acute cystitis with hematuria - POCT urinalysis dipstick - Urine Culture - nitrofurantoin, macrocrystal-monohydrate, (MACROBID) 100 MG capsule; Take 1 capsule (100 mg total) by mouth 2 (two) times daily.  Dispense: 10 capsule; Refill: 0 -Push fluids -Notify office if symptoms worsen or fail to improve  2. Chronic fatigue - CBC with Differential - TSH - Comprehensive metabolic panel  3. Late onset Alzheimer's disease without behavioral disturbance ([place])  4. Constipation, unspecified constipation type - CBC with Differential - TSH - Comprehensive metabolic panel   Follow-up: PRN, if symptoms fail to improve or worsen         Pt has scheduled fasting appointment with Dr Tobie Poet 01/15/21 at [facility] [place], [place], [place]

## 2020-11-18 NOTE — Patient Instructions (Addendum)
Push fluids Begin Macrobid 100 mg twice daily for 5 days Notify office if symptoms do not improve or worsen (fever, chills, back pain, etc) Fatigue If you have fatigue, you feel tired all the time and have a lack of energy or a lack of motivation. Fatigue may make it difficult to start or complete tasks because of exhaustion. In general, occasional or mild fatigue is often a normal response to activity or life. However, long-lasting (chronic) or extreme fatigue may be a symptom of a medical condition. Follow these instructions at home: General instructions  Watch your fatigue for any changes.  Go to bed and get up at the same time every day.  Avoid fatigue by pacing yourself during the day and getting enough sleep at night.  Maintain a healthy weight. Medicines  Take over-the-counter and prescription medicines only as told by your health care provider.  Take a multivitamin, if told by your health care provider.  Do not use herbal or dietary supplements unless they are approved by your health care provider. Activity   Exercise regularly, as told by your health care provider.  Use or practice techniques to help you relax, such as yoga, tai chi, meditation, or massage therapy. Eating and drinking   Avoid heavy meals in the evening.  Eat a well-balanced diet, which includes lean proteins, whole grains, plenty of fruits and vegetables, and low-fat dairy products.  Avoid consuming too much caffeine.  Avoid the use of alcohol.  Drink enough fluid to keep your urine pale yellow. Lifestyle  Change situations that cause you stress. Try to keep your work and personal schedule in balance.  Do not use any products that contain nicotine or tobacco, such as cigarettes and e-cigarettes. If you need help quitting, ask your health care provider.  Do not use drugs. Contact a health care provider if:  Your fatigue does not get better.  You have a fever.  You suddenly lose or gain  weight.  You have headaches.  You have trouble falling asleep or sleeping through the night.  You feel angry, guilty, anxious, or sad.  You are unable to have a bowel movement (constipation).  Your skin is dry.  You have swelling in your legs or another part of your body. Get help right away if:  You feel confused.  Your vision is blurry.  You feel faint or you pass out.  You have a severe headache.  You have severe pain in your abdomen, your back, or the area between your waist and hips (pelvis).  You have chest pain, shortness of breath, or an irregular or fast heartbeat.  You are unable to urinate, or you urinate less than normal.  You have abnormal bleeding, such as bleeding from the rectum, vagina, nose, lungs, or nipples.  You vomit blood.  You have thoughts about hurting yourself or others. If you ever feel like you may hurt yourself or others, or have thoughts about taking your own life, get help right away. You can go to your nearest emergency department or call:  Your local emergency services (911 in the [place]  A suicide crisis helpline, such as the National Suicide Prevention Lifeline at 3301214839. This is open 24 hours a day. Summary  If you have fatigue, you feel tired all the time and have a lack of energy or a lack of motivation.  Fatigue may make it difficult to start or complete tasks because of exhaustion.  Long-lasting (chronic) or extreme fatigue may be a symptom  of a medical condition.  Exercise regularly, as told by your health care provider.  Change situations that cause you stress. Try to keep your work and personal schedule in balance. This information is not intended to replace advice given to you by your health care provider. Make sure you discuss any questions you have with your health care provider. Document Revised: 06/20/2019 Document Reviewed: 08/24/2017 Elsevier Patient Education  2020 [employer].  We will call you lab  results  Thyroid-Stimulating Hormone Test Why am I having this test? You may have a thyroid-stimulating hormone (TSH) test if you have possible symptoms of abnormal thyroid hormone levels. This test can help your health care provider:  Diagnose a disorder of the thyroid gland or pituitary gland.  Manage your condition and treatment if you have an underactive thyroid (hypothyroidism) or an overactive thyroid (hyperthyroidism). Newborn babies may have this test done to screen for hypothyroidism that is present at birth (congenital). The thyroid is a gland in the lower front of the neck. It makes hormones that affect many body parts and systems, including the system that affects how quickly the body burns fuel for energy (metabolism). The pituitary gland is located just below the brain, behind the eyes and nasal passages. It helps maintain thyroid hormone levels and thyroid gland function. What is being tested? This test measures the amount of TSH in your blood. TSH may also be called thyrotropin. When the thyroid does not make enough hormones, the pituitary gland releases TSH into the bloodstream to stimulate the thyroid gland to make more hormones. What kind of sample is taken?     A blood sample is required for this test. It is usually collected by inserting a needle into a blood vessel. For newborns, a small amount of blood may be collected from the umbilical cord, or by using a small needle to prick the baby's heel (heel stick). Tell a health care provider about:  All medicines you are taking, including vitamins, herbs, eye drops, creams, and over-the-counter medicines.  Any blood disorders you have.  Any surgeries you have had.  Any medical conditions you have.  Whether you are pregnant or may be pregnant. How are the results reported? Your test results will be reported as a value that indicates how much TSH is in your blood. Your health care provider will compare your results to  normal ranges that were established after testing a large group of people (reference ranges). Reference ranges may vary among labs and hospitals. For this test, common reference ranges are:  Adult: 2-10 microunits/mL or 2-10 milliunits/L.  Newborn: ? Heel stick: 3-18 microunits/mL or 3-18 milliunits/L. ? Umbilical cord: 3-12 microunits/mL or 3-12 milliunits/L. What do the results mean? Results that are within the reference range are considered normal. This means that you have a normal amount of TSH in your blood. Results that are higher than the reference range mean that your TSH levels are too high. This may mean:  Your thyroid gland is not making enough thyroid hormones.  Your thyroid medicine dosage is too low.  You have a tumor on your pituitary gland. This is rare. Results that are lower than the reference range mean that your TSH levels are too low. This may be caused by hyperthyroidism or by a problem with the pituitary gland function. Talk with your health care provider about what your results mean. Questions to ask your health care provider Ask your health care provider, or the department that is doing the test:  When will my results be ready?  How will I get my results?  What are my treatment options?  What other tests do I need?  What are my next steps? Summary  You may have a thyroid-stimulating hormone (TSH) test if you have possible symptoms of abnormal thyroid hormone levels.  The thyroid is a gland in the lower front of the neck. It makes hormones that affect many body parts and systems.  The pituitary gland is located just below the brain, behind the eyes and nasal passages. It helps maintain thyroid hormone levels and thyroid gland function.  This test measures the amount of TSH in your blood. TSH is made by the pituitary gland. It may also be called thyrotropin. This information is not intended to replace advice given to you by your health care provider. Make  sure you discuss any questions you have with your health care provider. Document Revised: 02/27/2018 Document Reviewed: 08/02/2017 Elsevier Patient Education  [place].  Nitrofurantoin tablets or capsules What is this medicine? NITROFURANTOIN (nye troe fyoor AN toyn) is an antibiotic. It is used to treat urinary tract infections. This medicine may be used for other purposes; ask your health care provider or pharmacist if you have questions. COMMON BRAND NAME(S): Macrobid, Macrodantin, Urotoin What should I tell my health care provider before I take this medicine? They need to know if you have any of these conditions:  anemia  diabetes  glucose-6-phosphate dehydrogenase deficiency  kidney disease  liver disease  lung disease  other chronic illness  an unusual or allergic reaction to nitrofurantoin, other antibiotics, other medicines, foods, dyes or preservatives  pregnant or trying to get pregnant  breast-feeding How should I use this medicine? Take this medicine by mouth with a glass of water. Follow the directions on the prescription label. Take this medicine with food or milk. Take your doses at regular intervals. Do not take your medicine more often than directed. Do not stop taking except on your doctor's advice. Talk to your pediatrician regarding the use of this medicine in children. While this drug may be prescribed for selected conditions, precautions do apply. Overdosage: If you think you have taken too much of this medicine contact a poison control center or emergency room at once. NOTE: This medicine is only for you. Do not share this medicine with others. What if I miss a dose? If you miss a dose, take it as soon as you can. If it is almost time for your next dose, take only that dose. Do not take double or extra doses. What may interact with this medicine?  antacids containing magnesium trisilicate  probenecid  quinolone antibiotics like  ciprofloxacin, lomefloxacin, norfloxacin and ofloxacin  sulfinpyrazone This list may not describe all possible interactions. Give your health care provider a list of all the medicines, herbs, non-prescription drugs, or dietary supplements you use. Also tell them if you smoke, drink alcohol, or use illegal drugs. Some items may interact with your medicine. What should I watch for while using this medicine? Tell your doctor or health care professional if your symptoms do not improve or if you get new symptoms. Drink several glasses of water a day. If you are taking this medicine for a long time, visit your doctor for regular checks on your progress. If you are diabetic, you may get a false positive result for sugar in your urine with certain brands of urine tests. Check with your doctor. What side effects may I notice from receiving  this medicine? Side effects that you should report to your doctor or health care professional as soon as possible:  allergic reactions like skin rash or hives, swelling of the face, lips, or tongue  chest pain  cough  difficulty breathing  dizziness, drowsiness  fever or infection  joint aches or pains  pale or blue-tinted skin  redness, blistering, peeling or loosening of the skin, including inside the mouth  tingling, burning, pain, or numbness in hands or feet  unusual bleeding or bruising  unusually weak or tired  yellowing of eyes or skin Side effects that usually do not require medical attention (report to your doctor or health care professional if they continue or are bothersome):  dark urine  diarrhea  headache  loss of appetite  nausea or vomiting  temporary hair loss This list may not describe all possible side effects. Call your doctor for medical advice about side effects. You may report side effects to FDA at 1-800-FDA-1088. Where should I keep my medicine? Keep out of the reach of children. Store at room temperature between  15 and 30 degrees C (59 and 86 degrees F). Protect from light. Throw away any unused medicine after the expiration date. NOTE: This sheet is a summary. It may not cover all possible information. If you have questions about this medicine, talk to your doctor, pharmacist, or health care provider.  2020 Elsevier/Gold Standard (2008-06-19 15:56:47) Acute Urinary Retention, Female  Acute urinary retention means that you cannot pee (urinate) at all, or that you pee too little and your bladder is not emptied completely. If it is not treated, it can lead to kidney damage or other serious problems. Follow these instructions at home:  Take over-the-counter and prescription medicines only as told by your doctor. Ask your doctor what medicines you should stay away from. Do not take any medicine unless your doctor says it is okay to do so.  If you were sent home with a tube that drains pee from the bladder (catheter), take care of it as told by your doctor.  Drink enough fluid to keep your pee clear or pale yellow.  If you were given an antibiotic, take it as told by your doctor. Do not stop taking the antibiotic even if you start to feel better.  Do not use any products that contain nicotine or tobacco, such as cigarettes and e-cigarettes. If you need help quitting, ask your doctor.  Watch for changes in your symptoms. Tell your doctor about them.  If told, keep track of any changes in your blood pressure at home. Tell your doctor about them.  Keep all follow-up visits as told by your doctor. This is important. Contact a doctor if:  You have spasms or you leak pee when you have spasms. Get help right away if:  You have chills or a fever.  You have blood in your pee.  You have a tube that drains the bladder and: ? The tube stops draining pee. ? The tube falls out. Summary  Acute urinary retention means that you cannot pee at all, or that you pee too little and your bladder is not emptied  completely. If it is not treated, it can result in kidney damage or other serious problems.  If you were sent home with a tube that drains pee from the bladder, take care of it as told by your doctor.  Pay attention to any changes in your symptoms. Tell your doctor about them. This information is not  intended to replace advice given to you by your health care provider. Make sure you discuss any questions you have with your health care provider. Document Revised: 11/11/2017 Document Reviewed: 12/31/2016 Elsevier Patient Education  [place].

## 2020-11-20 ENCOUNTER — Telehealth: Payer: Self-pay | Admitting: Nurse Practitioner

## 2020-11-20 ENCOUNTER — Other Ambulatory Visit: Payer: Self-pay | Admitting: Nurse Practitioner

## 2020-11-20 ENCOUNTER — Telehealth: Payer: Self-pay

## 2020-11-20 ENCOUNTER — Encounter: Payer: Self-pay | Admitting: Nurse Practitioner

## 2020-11-20 MED ORDER — ONDANSETRON 4 MG PO TBDP: 4.0000 mg | ORAL_TABLET | Freq: Three times a day (TID) | ORAL | 0 refills | Status: DC | PRN

## 2020-11-20 MED ORDER — CIPROFLOXACIN HCL 250 MG PO TABS: 250.0000 mg | ORAL_TABLET | Freq: Two times a day (BID) | ORAL | 0 refills | Status: AC

## 2020-11-20 NOTE — Telephone Encounter (Signed)
Pt intolerant of Macrobid for UTI (nausea and vomiting). Discontinued Macrobid. Cipro 250mg  BID x  5 days. Zofran 4 mg ODT PRN for nausea and vomiting. Patient's spouse notified by phone of medication change. Macrobid added to allergy list.

## 2020-11-20 NOTE — Telephone Encounter (Signed)
Patient's husband [name] called and stated that ever since the patient started the macrobid, that she has thrown up 3 times and cant seem to keep her food down! He is wanting to know if we can change her antibiotic to something different? LA

## 2020-11-24 ENCOUNTER — Other Ambulatory Visit: Payer: Self-pay | Admitting: Physician Assistant

## 2020-11-24 LAB — URINE CULTURE

## 2021-01-15 ENCOUNTER — Ambulatory Visit: Payer: Medicare PPO | Admitting: Family Medicine

## 2021-01-26 ENCOUNTER — Encounter: Payer: Self-pay | Admitting: Family Medicine

## 2021-01-26 ENCOUNTER — Other Ambulatory Visit: Payer: Self-pay

## 2021-01-26 ENCOUNTER — Ambulatory Visit: Payer: Medicare PPO | Admitting: Family Medicine

## 2021-01-26 VITALS — BP 110/60 | HR 78 | Temp 97.2°F | Resp 18 | Ht 63.0 in | Wt 108.0 lb

## 2021-01-26 DIAGNOSIS — K219 Gastro-esophageal reflux disease without esophagitis: Secondary | ICD-10-CM | POA: Diagnosis not present

## 2021-01-26 DIAGNOSIS — F0280 Dementia in other diseases classified elsewhere without behavioral disturbance: Secondary | ICD-10-CM

## 2021-01-26 DIAGNOSIS — F330 Major depressive disorder, recurrent, mild: Secondary | ICD-10-CM

## 2021-01-26 DIAGNOSIS — M816 Localized osteoporosis [Lequesne]: Secondary | ICD-10-CM | POA: Diagnosis not present

## 2021-01-26 DIAGNOSIS — G301 Alzheimer's disease with late onset: Secondary | ICD-10-CM

## 2021-01-26 NOTE — Patient Instructions (Signed)
Osteoporosis  Osteoporosis is when the bones get thin and weak. This can cause your bones to break (fracture) more easily. What are the causes? The exact cause of this condition is not known. What increases the risk?  Having family members with this condition.  Not eating enough healthy foods.  Taking certain medicines.  Being female.  Being age 82 or older.  Smoking or using other products that contain nicotine or tobacco, such as e-cigarettes or chewing tobacco.  Not exercising.  Being of European or Asian ancestry.  Having a small body frame. What are the signs or symptoms? A broken bone might be the first sign, especially if the break results from a fall or injury that usually would not cause a bone to break. Other signs and symptoms include:  Pain in the neck or low back.  Being hunched over (stooped posture).  Getting shorter. How is this treated?  Eating more foods with more calcium and vitamin D in them.  Doing exercises.  Stopping tobacco use.  Limiting how much alcohol you drink.  Taking medicines to slow bone loss or help make the bones stronger.  Taking supplements of calcium and vitamin D every day.  Taking medicines to replace chemicals in the body (hormone replacement medicines).  Monitoring your levels of calcium and vitamin D. The goal of treatment is to strengthen your bones and lower your risk for a bone break. Follow these instructions at home: Eating and drinking Eat plenty of calcium and vitamin D. These nutrients are good for your bones. Good sources of calcium and vitamin D include:  Some fish, such as salmon and tuna.  Foods that have calcium and vitamin D added to them (fortified foods), such as some breakfast cereals.  Egg yolks.  Cheese.  Liver.   Activity Do exercises as told by your doctor. Ask your doctor what exercises are safe for you. You should do:  Exercises that make your muscles work to hold your body weight up  (weight-bearing exercises). These include tai chi, yoga, and walking.  Exercises to make your muscles stronger. One example is lifting weights. Lifestyle  Do not drink alcohol if: ? Your doctor tells you not to drink. ? You are pregnant, may be pregnant, or are planning to become pregnant.  If you drink alcohol: ? Limit how much you use to:  0-1 drink a day for women.  0-2 drinks a day for men.  Know how much alcohol is in your drink. In the [place] one drink equals one 12 oz bottle of beer (355 mL), one 5 oz glass of wine (148 mL), or one 1 oz glass of hard liquor (44 mL).  Do not smoke or use any products that contain nicotine or tobacco. If you need help quitting, ask your doctor. Preventing falls  Use tools to help you move around (mobility aids) as needed. These include canes, walkers, scooters, and crutches.  Keep rooms well-lit.  Put away things on the floor that could make you trip. These include cords and rugs.  Install safety rails on stairs. Install grab bars in bathrooms.  Use rubber mats in slippery areas, like bathrooms.  Wear shoes that: ? Fit you well. ? Support your feet. ? Have closed toes. ? Have rubber soles or low heels.  Tell your doctor about all of the medicines you are taking. Some medicines can make you more likely to fall. General instructions  Take over-the-counter and prescription medicines only as told by your doctor.    Keep all follow-up visits. Contact a doctor if:  You have not been tested (screened) for osteoporosis and you are: ? A woman who is age 65 or older. ? A man who is age 70 or older. Get help right away if:  You fall.  You get hurt. Summary  Osteoporosis happens when your bones get thin and weak.  Weak bones can break (fracture) more easily.  Eat plenty of calcium and vitamin D. These are good for your bones.  Tell your doctor about all of the medicines that you take. This information is not intended to replace  advice given to you by your health care provider. Make sure you discuss any questions you have with your health care provider. Document Revised: 05/15/2020 Document Reviewed: 05/15/2020 Elsevier Patient Education  2021 Elsevier Inc.  

## 2021-01-26 NOTE — Progress Notes (Signed)
Subjective:  Patient ID: Alexandria Jones, female    DOB: 02-08-1941  Age: 80 y.o. MRN: [id number]  Chief Complaint  Patient presents with  . Gastroesophageal Reflux    HPI Late onset Alzheimer's disease without behavioral disturbance (HCC) On namenda. Tried aricept, but did not make a difference. Seems to be worsening, but still is able to bathe, dress, and no toileting issues. She confuses her daughters with her granddaughters. She is not cooking or driving. She does need to help with cueing for bathing. She is eating much better and has gained weight.   Gastroesophageal reflux disease without esophagitis Eating better. On no GI medicines.   Osteoporosis: Not taking calcium with vitamin D.   Depression, mild, recurrent: On effexor xr 37.5 mg once daily.   Current Outpatient Medications on File Prior to Visit  Medication Sig Dispense Refill  . acetaminophen (TYLENOL) 500 MG tablet Take 1,000 mg by mouth every 6 (six) hours as needed.    . memantine (NAMENDA) 10 MG tablet TAKE 1 TABLET(10 MG) BY MOUTH TWICE DAILY 180 tablet 1  . venlafaxine XR (EFFEXOR-XR) 37.5 MG 24 hr capsule TAKE 1 CAPSULE(37.5 MG) BY MOUTH DAILY 90 capsule 0   No current facility-administered medications on file prior to visit.   Past Medical History:  Diagnosis Date  . Breast cancer ([place]) 1990   cured S/P radiation treatment & chemo  . GERD (gastroesophageal reflux disease)   . Memory difficulty 03/06/2018  . Migraines   . Osteoarthritis    Past Surgical History:  Procedure Laterality Date  . APPENDECTOMY    . BREAST LUMPECTOMY    . left lumpectomy  1990  . TUBAL LIGATION    . WRIST SURGERY      Family History  Problem Relation Age of Onset  . Dementia Mother   . Cancer Mother   . Dementia Father   . Depression Father   . Cancer Brother   . Cancer Sister    Social History   Socioeconomic History  . Marital status: Married    Spouse name: Not on file  . Number of children: Not on  file  . Years of education: Masters- education  . Highest education level: Not on file  Occupational History  . Not on file  Tobacco Use  . Smoking status: Never Smoker  . Smokeless tobacco: Never Used  Vaping Use  . Vaping Use: Never used  Substance and Sexual Activity  . Alcohol use: Yes    Comment: Rare- wine  . Drug use: Never  . Sexual activity: Not on file  Other Topics Concern  . Not on file  Social History Narrative   Lives with husband   Caffeine use: sometimes tea   Right handed    Social Determinants of Health   Financial Resource Strain: Not on file  Food Insecurity: Not on file  Transportation Needs: Not on file  Physical Activity: Not on file  Stress: Not on file  Social Connections: Not on file    Review of Systems  Constitutional: Positive for fatigue. Negative for chills and fever.  HENT: Positive for rhinorrhea. Negative for congestion, ear pain and sore throat.   Respiratory: Negative for cough and shortness of breath.   Cardiovascular: Negative for chest pain.  Gastrointestinal: Negative for abdominal pain, constipation, diarrhea, nausea and vomiting.  Genitourinary: Negative for dysuria and urgency.  Musculoskeletal: Negative for arthralgias and myalgias.  Skin: Negative for rash.  Neurological: Positive for weakness and headaches. Negative for  dizziness.  Psychiatric/Behavioral: Negative for dysphoric mood. The patient is not nervous/anxious.      Objective:  BP 110/60   Pulse 78   Temp (!) 97.2 F (36.2 C)   Resp 18   Ht 5\' 3"  (1.6 m)   Wt 108 lb (49 kg)   BMI 19.13 kg/m   BP/Weight 01/26/2021 16/08/4502 07/21/8279  Systolic BP 034 917 915  Diastolic BP 60 86 74  Wt. (Lbs) 108 107.6 106  BMI 19.13 19.06 18.78    Physical Exam Vitals reviewed.  Constitutional:      Appearance: Normal appearance. She is normal weight.  Neck:     Vascular: No carotid bruit.  Cardiovascular:     Rate and Rhythm: Normal rate and regular rhythm.      Pulses: Normal pulses.     Heart sounds: Normal heart sounds.  Pulmonary:     Effort: Pulmonary effort is normal. No respiratory distress.     Breath sounds: Normal breath sounds.  Abdominal:     General: Abdomen is flat. Bowel sounds are normal.     Palpations: Abdomen is soft.     Tenderness: There is no abdominal tenderness.  Neurological:     Mental Status: She is alert and oriented to person, place, and time.  Psychiatric:        Mood and Affect: Mood normal.        Behavior: Behavior normal.   MMSE 21/30.   Lab Results  Component Value Date   WBC 5.0 11/18/2020   HGB 14.1 11/18/2020   HCT 40.7 11/18/2020   PLT 277 11/18/2020   GLUCOSE 77 11/18/2020   ALT 12 11/18/2020   AST 20 11/18/2020   NA 142 11/18/2020   K 4.9 11/18/2020   CL 105 11/18/2020   CREATININE 0.87 11/18/2020   BUN 10 11/18/2020   CO2 23 11/18/2020   TSH 1.760 11/18/2020      Assessment & Plan:  1. Late onset Alzheimer's disease without behavioral disturbance ([place]) The current medical regimen is effective;  continue present plan and medications. Husband prefers not to start another medicine at this time.  2. Gastroesophageal reflux disease without esophagitis Continue to eat healthy.   3. Localized osteoporosis without current pathological fracture Recommend start on calcium with vitamin D.   4. Mild recurrent major depression ([place])  The current medical regimen is effective;  continue present plan and medications.  Time based: 30 minutes - reviewed chart. Reviewed MMSE.    Follow-up: Return in about 6 months (around 07/26/2021) for does not need to fast. .  An After Visit Summary was printed and given to the patient.  Rochel Brome, MD [facility] [phone]

## 2021-03-09 ENCOUNTER — Other Ambulatory Visit: Payer: Self-pay | Admitting: Physician Assistant

## 2021-03-24 DIAGNOSIS — H35033 Hypertensive retinopathy, bilateral: Secondary | ICD-10-CM | POA: Diagnosis not present

## 2021-03-24 DIAGNOSIS — H26493 Other secondary cataract, bilateral: Secondary | ICD-10-CM | POA: Diagnosis not present

## 2021-03-24 DIAGNOSIS — Z961 Presence of intraocular lens: Secondary | ICD-10-CM | POA: Diagnosis not present

## 2021-05-05 ENCOUNTER — Other Ambulatory Visit: Payer: Self-pay | Admitting: Family Medicine

## 2021-05-19 ENCOUNTER — Other Ambulatory Visit: Payer: Self-pay | Admitting: Physician Assistant

## 2021-06-16 ENCOUNTER — Ambulatory Visit: Payer: Medicare PPO | Admitting: Family Medicine

## 2021-06-16 ENCOUNTER — Other Ambulatory Visit: Payer: Self-pay

## 2021-06-16 VITALS — BP 122/70 | HR 77 | Temp 97.3°F | Ht 63.0 in | Wt 110.0 lb

## 2021-06-16 DIAGNOSIS — R300 Dysuria: Secondary | ICD-10-CM

## 2021-06-16 LAB — POCT URINALYSIS DIPSTICK
Bilirubin, UA: NEGATIVE
Blood, UA: NEGATIVE
Glucose, UA: NEGATIVE
Ketones, UA: NEGATIVE
Leukocytes, UA: NEGATIVE
Nitrite, UA: NEGATIVE
Protein, UA: NEGATIVE
Spec Grav, UA: 1.01 (ref 1.010–1.025)
Urobilinogen, UA: 0.2 E.U./dL
pH, UA: 7 (ref 5.0–8.0)

## 2021-06-16 NOTE — Progress Notes (Signed)
Acute Office Visit  Subjective:    Patient ID: Alexandria Jones, female    DOB: [birth year], 80 y.o.   MRN: [id number]  Chief Complaint  Patient presents with   Urinary Tract Infection   HPI Patient is in today for burning and urgency of urination. All started on Sunday.  Symptoms resolved yesterday.   Past Medical History:  Diagnosis Date   Breast cancer ([place]) 1990   cured S/P radiation treatment & chemo   GERD (gastroesophageal reflux disease)    Memory difficulty 03/06/2018   Migraines    Osteoarthritis     Past Surgical History:  Procedure Laterality Date   APPENDECTOMY     BREAST LUMPECTOMY     left lumpectomy  1990   TUBAL LIGATION     WRIST SURGERY      Family History  Problem Relation Age of Onset   Dementia Mother    Cancer Mother    Dementia Father    Depression Father    Cancer Brother    Cancer Sister     Social History   Socioeconomic History   Marital status: Married    Spouse name: Not on file   Number of children: Not on file   Years of education: Masters- education   Highest education level: Not on file  Occupational History   Not on file  Tobacco Use   Smoking status: Never   Smokeless tobacco: Never  Vaping Use   Vaping Use: Never used  Substance and Sexual Activity   Alcohol use: Yes    Comment: Rare- wine   Drug use: Never   Sexual activity: Not on file  Other Topics Concern   Not on file  Social History Narrative   Lives with husband   Caffeine use: sometimes tea   Right handed    Social Determinants of Health   Financial Resource Strain: Not on file  Food Insecurity: Not on file  Transportation Needs: Not on file  Physical Activity: Not on file  Stress: Not on file  Social Connections: Not on file  Intimate Partner Violence: Not on file    Outpatient Medications Prior to Visit  Medication Sig Dispense Refill   acetaminophen (TYLENOL) 500 MG tablet Take 1,000 mg by mouth every 6 (six) hours as needed.      memantine (NAMENDA) 10 MG tablet TAKE 1 TABLET(10 MG) BY MOUTH TWICE DAILY 180 tablet 1   venlafaxine XR (EFFEXOR-XR) 37.5 MG 24 hr capsule TAKE 1 CAPSULE(37.5 MG) BY MOUTH DAILY 90 capsule 0   No facility-administered medications prior to visit.    Allergies  Allergen Reactions   Macrobid [Nitrofurantoin] Nausea And Vomiting   Codeine Itching   Morphine Nausea Only    unknown unknown    Isosorbide Nitrate Other (See Comments)    unknown Other reaction(s): Other (See Comments) unknown     Review of Systems  Constitutional:  Negative for chills and fever.  Gastrointestinal:  Negative for abdominal pain, nausea and vomiting.  Genitourinary:  Positive for urgency.      Objective:    Physical Exam Vitals reviewed.  Constitutional:      Appearance: Normal appearance.  Neck:     Vascular: No carotid bruit.  Cardiovascular:     Rate and Rhythm: Normal rate and regular rhythm.     Pulses: Normal pulses.     Heart sounds: Normal heart sounds.  Pulmonary:     Effort: Pulmonary effort is normal.     Breath sounds:  Normal breath sounds.  Abdominal:     General: Bowel sounds are normal.     Palpations: Abdomen is soft.     Tenderness: There is no abdominal tenderness.  Neurological:     Mental Status: She is alert and oriented to person, place, and time.  Psychiatric:        Mood and Affect: Mood normal.        Behavior: Behavior normal.    BP 122/70   Pulse 77   Temp (!) 97.3 F (36.3 C)   Ht 5\' 3"  (1.6 m)   Wt 110 lb (49.9 kg)   SpO2 95%   BMI 19.49 kg/m  Wt Readings from Last 3 Encounters:  06/16/21 110 lb (49.9 kg)  01/26/21 108 lb (49 kg)  11/18/20 107 lb 9.6 oz (48.8 kg)    Health Maintenance Due  Topic Date Due   Zoster Vaccines- Shingrix (1 of 2) Never done   MAMMOGRAM  04/11/2021    There are no preventive care reminders to display for this patient.   Lab Results  Component Value Date   TSH 1.760 11/18/2020   Lab Results  Component Value  Date   WBC 5.0 11/18/2020   HGB 14.1 11/18/2020   HCT 40.7 11/18/2020   MCV 89 11/18/2020   PLT 277 11/18/2020   Lab Results  Component Value Date   NA 142 11/18/2020   K 4.9 11/18/2020   CO2 23 11/18/2020   GLUCOSE 77 11/18/2020   BUN 10 11/18/2020   CREATININE 0.87 11/18/2020   BILITOT 0.4 11/18/2020   ALKPHOS 70 11/18/2020   AST 20 11/18/2020   ALT 12 11/18/2020   PROT 6.4 11/18/2020   ALBUMIN 4.5 11/18/2020   CALCIUM 9.5 11/18/2020   No results found for: CHOL No results found for: HDL No results found for: LDLCALC No results found for: TRIG No results found for: CHOLHDL No results found for: HGBA1C     Assessment & Plan:  1. Dysuria - POCT urinalysis dipstick normal.  Resolved.    Rochel Brome, MD

## 2021-06-28 ENCOUNTER — Encounter: Payer: Self-pay | Admitting: Family Medicine

## 2021-07-28 ENCOUNTER — Other Ambulatory Visit: Payer: Self-pay

## 2021-07-28 ENCOUNTER — Encounter: Payer: Self-pay | Admitting: Family Medicine

## 2021-07-28 ENCOUNTER — Ambulatory Visit: Payer: Medicare PPO | Admitting: Family Medicine

## 2021-07-28 VITALS — BP 126/68 | HR 77 | Temp 97.5°F | Ht 62.0 in | Wt 108.0 lb

## 2021-07-28 DIAGNOSIS — F330 Major depressive disorder, recurrent, mild: Secondary | ICD-10-CM

## 2021-07-28 DIAGNOSIS — F0280 Dementia in other diseases classified elsewhere without behavioral disturbance: Secondary | ICD-10-CM | POA: Diagnosis not present

## 2021-07-28 DIAGNOSIS — K219 Gastro-esophageal reflux disease without esophagitis: Secondary | ICD-10-CM | POA: Diagnosis not present

## 2021-07-28 DIAGNOSIS — G301 Alzheimer's disease with late onset: Secondary | ICD-10-CM

## 2021-07-28 DIAGNOSIS — G308 Other Alzheimer's disease: Secondary | ICD-10-CM | POA: Diagnosis not present

## 2021-07-28 NOTE — Progress Notes (Signed)
Established Patient Office Visit  Subjective:  Patient ID: Alexandria Jones, female    DOB: October 03, 1941  Age: 80 y.o. MRN: [id number]  CC:  Chief Complaint  Patient presents with   Depression    HPI Late onset Alzheimer's disease without behavioral disturbance [facility]) Patient presents today for 6 months follow up. Husband requested that patient MME be repeated states "I think her memory is getting worse, she asked me 6 times why we were here and could not remember why we were at our [place] [for her birthday}" MME 13/30. She takes Namenda but her husband reports she seems to be getting more forgetful. She has tried Aricept in the past but did not see any improvement. She is currently still able to bathe and dress herself but does require prompting. She forgets to bathe, dress (wears same clothes repeatedly) unless prompted by her husband. Able to go to the bathroom independently. She does not cook. She is nearly always with her husband other than brief errands of less than an hour. His wife is happy reading, although she rereads the same books not realizing she just read it.  Good Appetite. She forgets that she had a meal and will request another meal.   Gastroesophageal reflux disease without esophagitis She denies any problems with reflux and takes Tums prn. She was previously on Prilosec but reports she no longer has needed to take this.   Mild recurrent major depression ([place])  She takes Effexor and denies any depression during the visit. Her husband is concerned that she has been depressed since the death of her sister over a year ago. He does not feel she needs an increase to her medicine.   Past Medical History:  Diagnosis Date   Breast cancer ([place]) 1990   cured S/P radiation treatment & chemo   GERD (gastroesophageal reflux disease)    Memory difficulty 03/06/2018   Migraines    Osteoarthritis     Past Surgical History:  Procedure Laterality Date   APPENDECTOMY      BREAST LUMPECTOMY     left lumpectomy  1990   TUBAL LIGATION     WRIST SURGERY      Family History  Problem Relation Age of Onset   Dementia Mother    Cancer Mother    Dementia Father    Depression Father    Cancer Brother    Cancer Sister     Social History   Socioeconomic History   Marital status: Married    Spouse name: Not on file   Number of children: Not on file   Years of education: Masters- education   Highest education level: Not on file  Occupational History   Not on file  Tobacco Use   Smoking status: Never   Smokeless tobacco: Never  Vaping Use   Vaping Use: Never used  Substance and Sexual Activity   Alcohol use: Yes    Comment: Rare- wine   Drug use: Never   Sexual activity: Not on file  Other Topics Concern   Not on file  Social History Narrative   Lives with husband   Caffeine use: sometimes tea   Right handed    Social Determinants of Health   Financial Resource Strain: Not on file  Food Insecurity: Not on file  Transportation Needs: Not on file  Physical Activity: Not on file  Stress: Not on file  Social Connections: Not on file  Intimate Partner Violence: Not on file    Outpatient Medications  Prior to Visit  Medication Sig Dispense Refill   acetaminophen (TYLENOL) 500 MG tablet Take 1,000 mg by mouth every 6 (six) hours as needed.     memantine (NAMENDA) 10 MG tablet TAKE 1 TABLET(10 MG) BY MOUTH TWICE DAILY 180 tablet 1   venlafaxine XR (EFFEXOR-XR) 37.5 MG 24 hr capsule TAKE 1 CAPSULE(37.5 MG) BY MOUTH DAILY 90 capsule 0   No facility-administered medications prior to visit.    Allergies  Allergen Reactions   Macrobid [Nitrofurantoin] Nausea And Vomiting   Codeine Itching   Morphine Nausea Only    unknown unknown    Isosorbide Nitrate Other (See Comments)    unknown Other reaction(s): Other (See Comments) unknown     ROS Review of Systems  Constitutional:  Negative for chills and fatigue.  HENT:  Negative for  congestion and sore throat.   Eyes:  Negative for visual disturbance.  Respiratory:  Negative for cough and shortness of breath.   Cardiovascular:  Negative for chest pain, palpitations and leg swelling.  Gastrointestinal:  Negative for abdominal pain, constipation, diarrhea, nausea and vomiting.  Endocrine: Negative for cold intolerance, heat intolerance, polyphagia and polyuria.  Genitourinary:  Negative for difficulty urinating, frequency and urgency.       No bladder (or bowel) incontinence.   Neurological:  Positive for headaches (occasionally). Negative for dizziness, weakness and numbness.  Psychiatric/Behavioral:  Positive for confusion. The patient is not nervous/anxious.      Objective:    Physical Exam Vitals reviewed.  HENT:     Head: Normocephalic.     Right Ear: Tympanic membrane, ear canal and external ear normal.     Left Ear: Tympanic membrane, ear canal and external ear normal.     Nose: Nose normal.     Mouth/Throat:     Mouth: Mucous membranes are moist.     Pharynx: Oropharynx is clear.  Neck:     Vascular: No carotid bruit.  Cardiovascular:     Rate and Rhythm: Normal rate and regular rhythm.     Pulses: Normal pulses.     Heart sounds: Normal heart sounds.  Pulmonary:     Effort: Pulmonary effort is normal.     Breath sounds: Normal breath sounds.  Abdominal:     General: Abdomen is flat. Bowel sounds are normal.     Palpations: Abdomen is soft.  Musculoskeletal:        General: Normal range of motion.  Skin:    General: Skin is warm and dry.  Neurological:     Mental Status: She is alert.     Comments: Memory loss  Psychiatric:        Mood and Affect: Mood normal.        Behavior: Behavior normal.    BP 126/68   Pulse 77   Temp (!) 97.5 F (36.4 C)   Ht '5\' 2"'$  (1.575 m)   Wt 108 lb (49 kg)   SpO2 98%   BMI 19.75 kg/m  Wt Readings from Last 3 Encounters:  07/28/21 108 lb (49 kg)  06/16/21 110 lb (49.9 kg)  01/26/21 108 lb (49 kg)      Health Maintenance Due  Topic Date Due   Zoster Vaccines- Shingrix (1 of 2) Never done   MAMMOGRAM  04/11/2021   INFLUENZA VACCINE  07/13/2021    There are no preventive care reminders to display for this patient.  Lab Results  Component Value Date   TSH 1.760 11/18/2020   Lab Results  Component Value Date   WBC 5.0 11/18/2020   HGB 14.1 11/18/2020   HCT 40.7 11/18/2020   MCV 89 11/18/2020   PLT 277 11/18/2020   Lab Results  Component Value Date   NA 142 11/18/2020   K 4.9 11/18/2020   CO2 23 11/18/2020   GLUCOSE 77 11/18/2020   BUN 10 11/18/2020   CREATININE 0.87 11/18/2020   BILITOT 0.4 11/18/2020   ALKPHOS 70 11/18/2020   AST 20 11/18/2020   ALT 12 11/18/2020   PROT 6.4 11/18/2020   ALBUMIN 4.5 11/18/2020   CALCIUM 9.5 11/18/2020   No results found for: CHOL No results found for: HDL No results found for: LDLCALC No results found for: TRIG No results found for: CHOLHDL No results found for: HGBA1C    Assessment & Plan:  1. Late onset Alzheimer's disease  without behavioral disturbance (HCC) - Worsening - MR Brain Wo Contrast - CBC with Differential/Platelet - Comprehensive metabolic panel - TSH - 123456 and Folate Panel - Folate   2. Gastroesophageal reflux disease without esophagitis - Well controlled - CBC with Differential/Platelet - Comprehensive metabolic panel  3. Mild recurrent major depression ([place]) - Controlled - Continue Effexor   Follow-up: Return in about 3 months (around 10/28/2021) for fasting.    Gayleen Orem, RN

## 2021-07-28 NOTE — Progress Notes (Signed)
Subjective:  Patient ID: Alexandria Jones, female    DOB: 1941/08/12  Age: 80 y.o. MRN: [id number]  Chief Complaint  Patient presents with   Depression    HPI Patient presents today for 6 months follow up. Husband requested that patient MME be repeated states "I think her memory is getting worse, she asked me 6 times why we were here and could not remember why we were at our [place] [for her birthday}" MME 13/30. Current Outpatient Medications on File Prior to Visit  Medication Sig Dispense Refill   acetaminophen (TYLENOL) 500 MG tablet Take 1,000 mg by mouth every 6 (six) hours as needed.     memantine (NAMENDA) 10 MG tablet TAKE 1 TABLET(10 MG) BY MOUTH TWICE DAILY 180 tablet 1   venlafaxine XR (EFFEXOR-XR) 37.5 MG 24 hr capsule TAKE 1 CAPSULE(37.5 MG) BY MOUTH DAILY 90 capsule 0   No current facility-administered medications on file prior to visit.   Past Medical History:  Diagnosis Date   Breast cancer ([place]) 1990   cured S/P radiation treatment & chemo   GERD (gastroesophageal reflux disease)    Memory difficulty 03/06/2018   Migraines    Osteoarthritis    Past Surgical History:  Procedure Laterality Date   APPENDECTOMY     BREAST LUMPECTOMY     left lumpectomy  1990   TUBAL LIGATION     WRIST SURGERY      Family History  Problem Relation Age of Onset   Dementia Mother    Cancer Mother    Dementia Father    Depression Father    Cancer Brother    Cancer Sister    Social History   Socioeconomic History   Marital status: Married    Spouse name: Not on file   Number of children: Not on file   Years of education: Masters- education   Highest education level: Not on file  Occupational History   Not on file  Tobacco Use   Smoking status: Never   Smokeless tobacco: Never  Vaping Use   Vaping Use: Never used  Substance and Sexual Activity   Alcohol use: Yes    Comment: Rare- wine   Drug use: Never   Sexual activity: Not on file  Other Topics  Concern   Not on file  Social History Narrative   Lives with husband   Caffeine use: sometimes tea   Right handed    Social Determinants of Health   Financial Resource Strain: Not on file  Food Insecurity: Not on file  Transportation Needs: Not on file  Physical Activity: Not on file  Stress: Not on file  Social Connections: Not on file    Review of Systems  Constitutional:  Negative for chills, fatigue and fever.  HENT:  Negative for congestion, ear pain, rhinorrhea and sore throat.   Respiratory:  Positive for cough (Mostly at night and in the a.m. periodically). Negative for shortness of breath.   Cardiovascular:  Negative for chest pain.  Gastrointestinal:  Negative for abdominal pain, constipation, diarrhea, nausea and vomiting.  Genitourinary:  Negative for dysuria and urgency.  Musculoskeletal:  Negative for back pain and myalgias.  Neurological:  Positive for headaches. Negative for dizziness, weakness and light-headedness.  Psychiatric/Behavioral:  Negative for dysphoric mood. The patient is not nervous/anxious.     Objective:  Pulse 77   Temp (!) 97.5 F (36.4 C)   Ht '5\' 2"'$  (1.575 m)   Wt 108 lb (49 kg)   SpO2  98%   BMI 19.75 kg/m   BP/Weight 07/28/2021 06/16/2021 [id number]  Systolic BP - 123XX123 [id number]  Diastolic BP - 70 60  Wt. (Lbs) 108 110 108  BMI 19.75 19.49 19.13    Physical Exam  Diabetic Foot Exam - Simple   No data filed      Lab Results  Component Value Date   WBC 5.0 11/18/2020   HGB 14.1 11/18/2020   HCT 40.7 11/18/2020   PLT 277 11/18/2020   GLUCOSE 77 11/18/2020   ALT 12 11/18/2020   AST 20 11/18/2020   NA 142 11/18/2020   K 4.9 11/18/2020   CL 105 11/18/2020   CREATININE 0.87 11/18/2020   BUN 10 11/18/2020   CO2 23 11/18/2020   TSH 1.760 11/18/2020      Assessment & Plan:   There are no diagnoses linked to this encounter.   No orders of the defined types were placed in this encounter.   No orders of the defined types were  placed in this encounter.    Follow-up: No follow-ups on file.  An After Visit Summary was printed and given to the patient.  Rochel Brome, MD [facility] [phone]

## 2021-07-29 LAB — CBC WITH DIFFERENTIAL/PLATELET
Basophils Absolute: 0.1 10*3/uL (ref 0.0–0.2)
Basos: 1 %
EOS (ABSOLUTE): 0.1 10*3/uL (ref 0.0–0.4)
Eos: 2 %
Hematocrit: 47 % — ABNORMAL HIGH (ref 34.0–46.6)
Hemoglobin: 15.4 g/dL (ref 11.1–15.9)
Immature Grans (Abs): 0 10*3/uL (ref 0.0–0.1)
Immature Granulocytes: 0 %
Lymphocytes Absolute: 1.3 10*3/uL (ref 0.7–3.1)
Lymphs: 25 %
MCH: 29.4 pg (ref 26.6–33.0)
MCHC: 32.8 g/dL (ref 31.5–35.7)
MCV: 90 fL (ref 79–97)
Monocytes Absolute: 0.7 10*3/uL (ref 0.1–0.9)
Monocytes: 13 %
Neutrophils Absolute: 2.9 10*3/uL (ref 1.4–7.0)
Neutrophils: 59 %
Platelets: 306 10*3/uL (ref 150–450)
RBC: 5.24 x10E6/uL (ref 3.77–5.28)
RDW: 12.6 % (ref 11.7–15.4)
WBC: 5 10*3/uL (ref 3.4–10.8)

## 2021-07-29 LAB — COMPREHENSIVE METABOLIC PANEL
ALT: 13 IU/L (ref 0–32)
AST: 19 IU/L (ref 0–40)
Albumin/Globulin Ratio: 2 (ref 1.2–2.2)
Albumin: 4.5 g/dL (ref 3.6–4.6)
Alkaline Phosphatase: 83 IU/L (ref 44–121)
BUN/Creatinine Ratio: 13 (ref 12–28)
BUN: 11 mg/dL (ref 8–27)
Bilirubin Total: 0.3 mg/dL (ref 0.0–1.2)
CO2: 24 mmol/L (ref 20–29)
Calcium: 10 mg/dL (ref 8.7–10.3)
Chloride: 102 mmol/L (ref 96–106)
Creatinine, Ser: 0.86 mg/dL (ref 0.57–1.00)
Globulin, Total: 2.2 g/dL (ref 1.5–4.5)
Glucose: 78 mg/dL (ref 65–99)
Potassium: 4.7 mmol/L (ref 3.5–5.2)
Sodium: 139 mmol/L (ref 134–144)
Total Protein: 6.7 g/dL (ref 6.0–8.5)
eGFR: 68 mL/min/{1.73_m2} (ref >59)

## 2021-07-29 LAB — B12 AND FOLATE PANEL
Folate: 12.2 ng/mL (ref >3.0)
Vitamin B-12: 206 pg/mL — ABNORMAL LOW (ref 232–1245)

## 2021-07-29 LAB — TSH: TSH: 1.3 u[IU]/mL (ref 0.450–4.500)

## 2021-07-30 ENCOUNTER — Other Ambulatory Visit: Payer: Self-pay

## 2021-07-30 ENCOUNTER — Other Ambulatory Visit: Payer: Self-pay | Admitting: Family Medicine

## 2021-07-30 MED ORDER — CYANOCOBALAMIN 1000 MCG/ML IJ SOLN: 1000.0000 ug | Freq: Once | INTRAMUSCULAR | 0 refills | Status: DC

## 2021-08-06 ENCOUNTER — Ambulatory Visit: Payer: Medicare PPO

## 2021-08-07 ENCOUNTER — Ambulatory Visit (INDEPENDENT_AMBULATORY_CARE_PROVIDER_SITE_OTHER): Payer: Medicare PPO

## 2021-08-07 ENCOUNTER — Other Ambulatory Visit: Payer: Self-pay

## 2021-08-07 DIAGNOSIS — E538 Deficiency of other specified B group vitamins: Secondary | ICD-10-CM

## 2021-08-07 MED ORDER — CYANOCOBALAMIN 1000 MCG/ML IJ SOLN
1000.0000 ug | Freq: Once | INTRAMUSCULAR | Status: AC
  Administered 2021-08-07: 1000 ug via INTRAMUSCULAR

## 2021-08-07 NOTE — Progress Notes (Signed)
   B12 injection given per order, patient tolerated well.   Erie Noe, LPN [id number] AM

## 2021-08-09 ENCOUNTER — Other Ambulatory Visit: Payer: Self-pay

## 2021-08-09 ENCOUNTER — Ambulatory Visit
Admission: RE | Admit: 2021-08-09 | Discharge: 2021-08-09 | Disposition: A | Discharge status: Home / Self Care | Payer: Medicare PPO | Source: Ambulatory Visit | Attending: Family Medicine | Admitting: Family Medicine

## 2021-08-09 DIAGNOSIS — G301 Alzheimer's disease with late onset: Secondary | ICD-10-CM | POA: Diagnosis not present

## 2021-08-09 DIAGNOSIS — G319 Degenerative disease of nervous system, unspecified: Secondary | ICD-10-CM | POA: Diagnosis not present

## 2021-08-09 DIAGNOSIS — R413 Other amnesia: Secondary | ICD-10-CM | POA: Diagnosis not present

## 2021-08-09 DIAGNOSIS — I6381 Other cerebral infarction due to occlusion or stenosis of small artery: Secondary | ICD-10-CM | POA: Diagnosis not present

## 2021-08-09 IMAGING — MR MR HEAD W/O CM
10 series · 48 of 48 positions shown · non-contrast
Comparison: Brain MRI [DATE].

CLINICAL DATA: Late onset Alzheimer's disease without behavioral
disturbance (HCC) G30.1, [DQ] ([DQ]-CM). Memory loss.

EXAM:
MRI HEAD WITHOUT CONTRAST
TECHNIQUE: Multiplanar, multiecho pulse sequences of the brain and surrounding
structures were obtained without intravenous contrast.

[Series 2: T1 · sagittal · 5.0mm · 0.45mm/px · 3 of 21 slices shown]
[im 1/21]
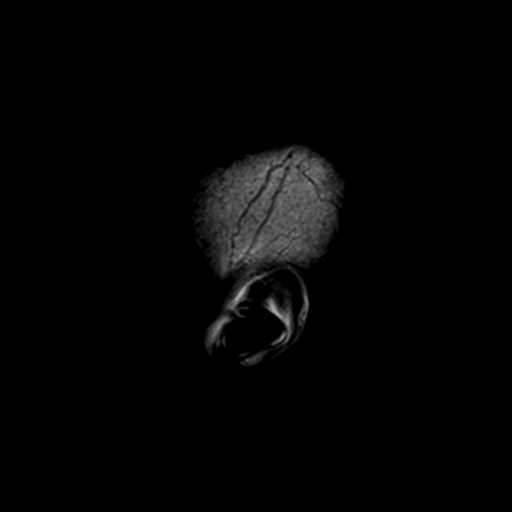
[im 11/21]
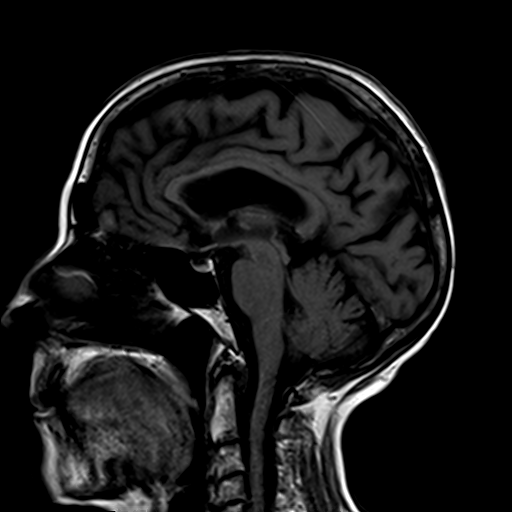
[im 21/21]
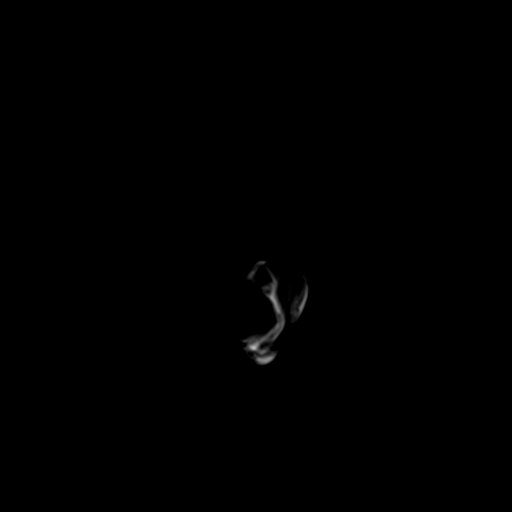

[Series 3: DWI · axial · 3.0mm · 1.80mm/px · z∈[-44,+105]mm · 8 of 101 slices shown (1 of 4)]
[im 1/101]
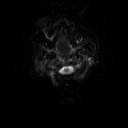
[im 15/101]
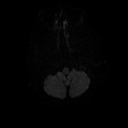
[im 29/101]
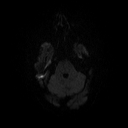
[im 43/101]
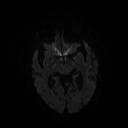
[im 58/101]
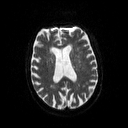
[im 72/101]
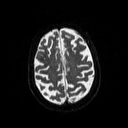
[im 86/101]
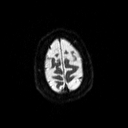
[im 101/101]
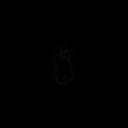

[Series 4: DWI · axial · 3.0mm · 1.80mm/px · z∈[-47,+105]mm · 4 of 52 slices shown (2 of 4)]
[im 1/52]
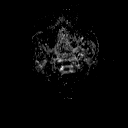
[im 18/52]
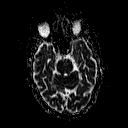
[im 35/52]
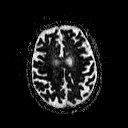
[im 52/52]
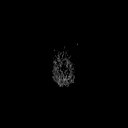

[Series 5: DWI · coronal · 5.0mm · 1.80mm/px · 6 of 70 slices shown (3 of 4)]
[im 1/70]
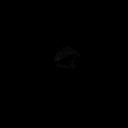
[im 14/70]
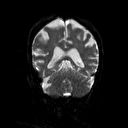
[im 28/70]
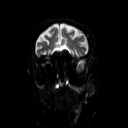
[im 42/70]
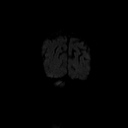
[im 56/70]
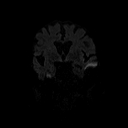
[im 70/70]
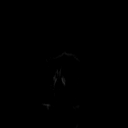

[Series 6: DWI · coronal · 5.0mm · 1.80mm/px · 3 of 35 slices shown (4 of 4)]
[im 1/35]
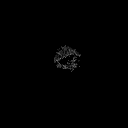
[im 18/35]
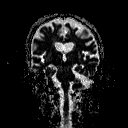
[im 35/35]
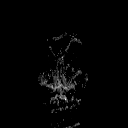

[Series 7: T2 · axial · 5.0mm · 0.75mm/px · z∈[-47,+105]mm · 2 of 23 slices shown (1 of 2)]
[im 1/23]
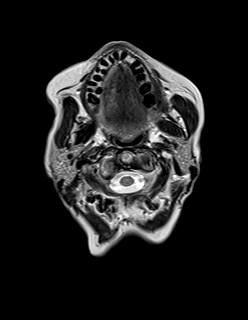
[im 23/23]
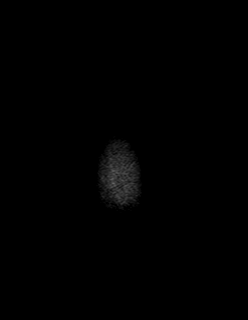

[Series 8: FLAIR · axial · 3.0mm · 0.47mm/px · z∈[-48,+105]mm · 3 of 34 slices shown]
[im 1/34]
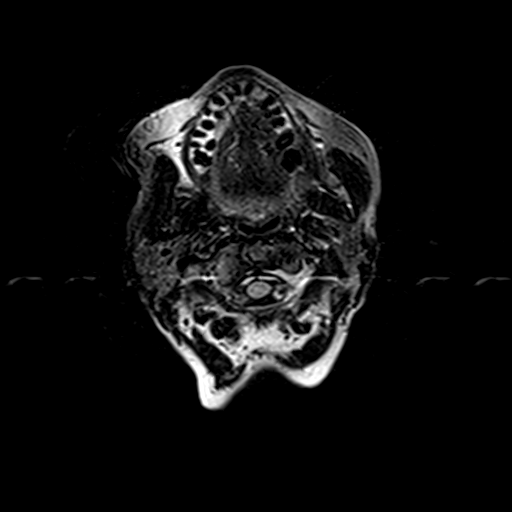
[im 17/34]
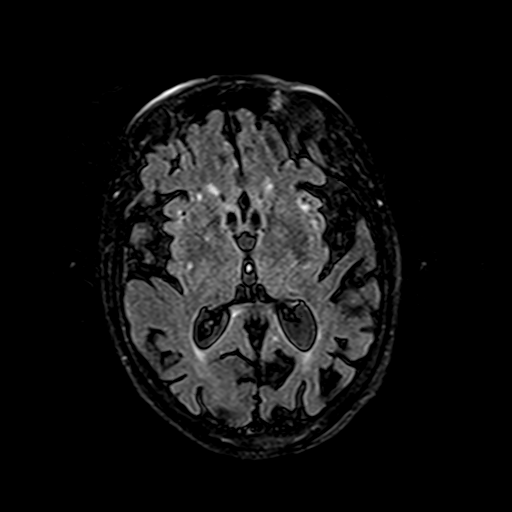
[im 34/34]
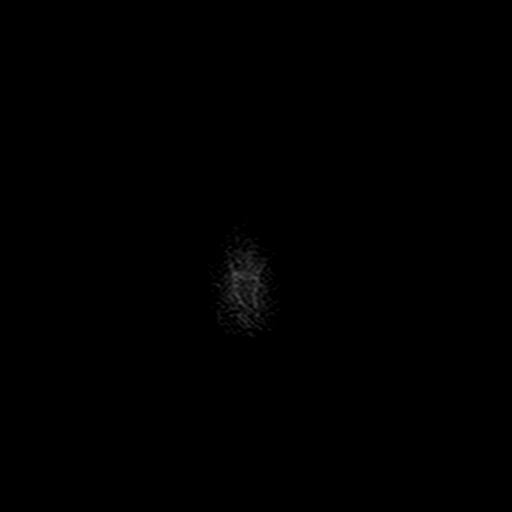

[Series 10: swi_images · axial · 4.0mm · 0.94mm/px · z∈[-50,+104]mm · 3 of 40 slices shown]
[im 1/40]
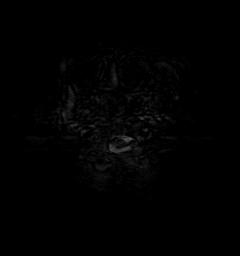
[im 20/40]
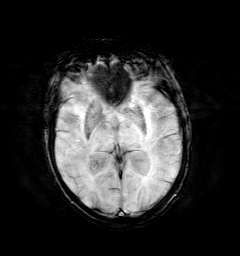
[im 40/40]
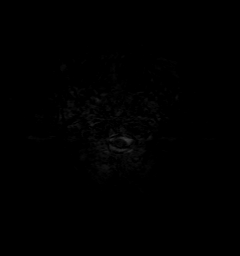

[Series 11: t1_mpr_(person_name) · axial · 1.0mm · 0.94mm/px · z∈[-50,+108]mm · 13 of 160 slices shown]
[im 1/160]
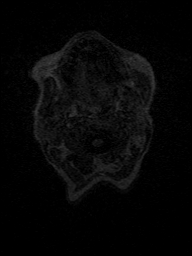
[im 14/160]
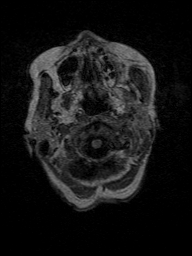
[im 27/160]
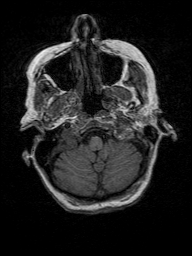
[im 40/160]
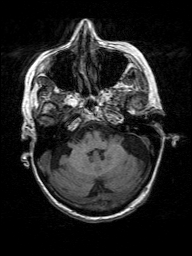
[im 54/160]
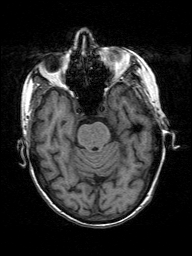
[im 67/160]
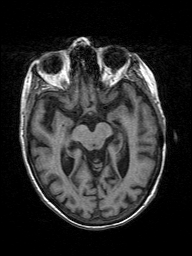
[im 80/160]
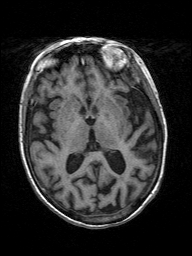
[im 93/160]
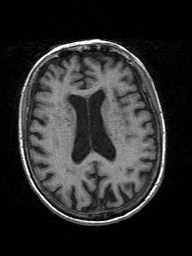
[im 107/160]
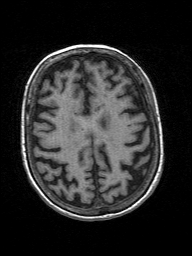
[im 120/160]
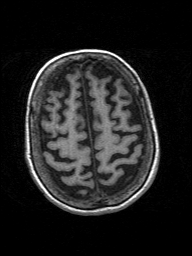
[im 133/160]
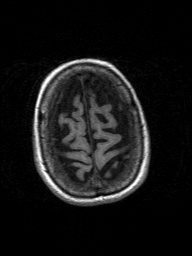
[im 146/160]
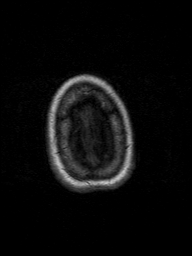
[im 160/160]
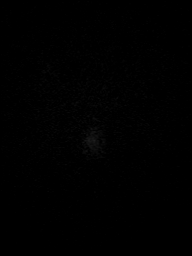

[Series 12: T2 · coronal · 5.0mm · 0.45mm/px · 3 of 35 slices shown (2 of 2)]
[im 1/35]
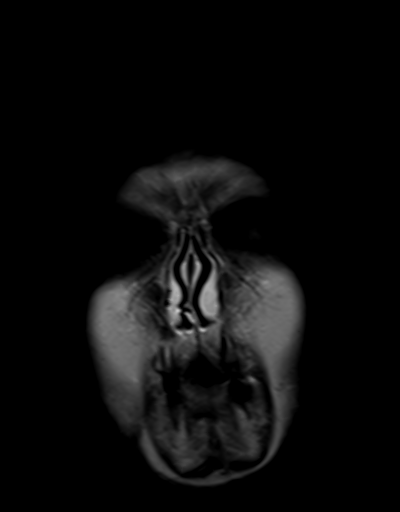
[im 18/35]
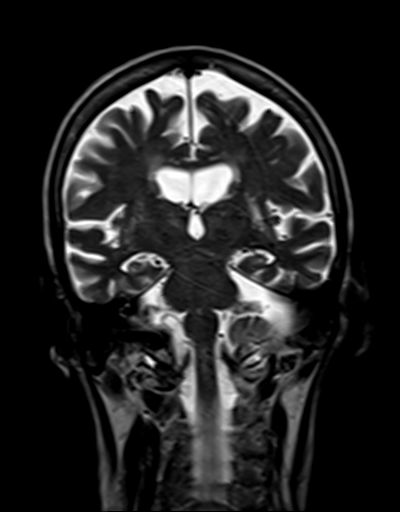
[im 35/35]
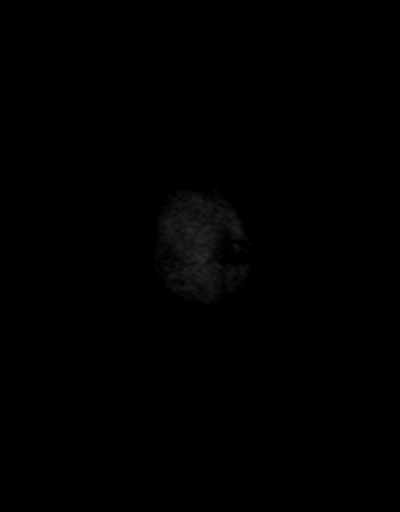

[48 of 48 positions shown; findings below may reference images not displayed]

FINDINGS: Brain:

Mild intermittent motion degradation.

Moderate generalized cerebral atrophy.

Moderate multifocal T2/FLAIR hyperintensity within the cerebral
white matter, nonspecific but compatible with chronic small vessel
ischemic disease. Mild chronic small-vessel ischemic changes are
also present within the pons.

Redemonstrated tiny chronic lacunar infarct within the right
cerebellar hemisphere.

There is no acute infarct.

No evidence of an intracranial mass.

No chronic intracranial blood products.

No extra-axial fluid collection.

No midline shift.

Vascular: Maintained flow voids within the proximal large arterial
vessels.

Skull and upper cervical spine: No focal suspicious marrow lesion.
Incompletely assessed cervical spondylosis.

Sinuses/Orbits: Visualized orbits show no acute finding. No
significant paranasal sinus disease.
IMPRESSION: Mildly motion degraded exam.

No evidence of acute intracranial abnormality.

Moderate chronic small-vessel ischemic changes within the cerebral
white matter, progressed from the brain MRI of [DATE]. Mild
chronic small-vessel ischemic changes are also present within the
pons.

Redemonstrated tiny chronic lacunar infarct within the right
cerebellar hemisphere.

Moderate generalized cerebral atrophy, progressed.

## 2021-08-14 ENCOUNTER — Ambulatory Visit (INDEPENDENT_AMBULATORY_CARE_PROVIDER_SITE_OTHER): Payer: Medicare PPO

## 2021-08-14 ENCOUNTER — Other Ambulatory Visit: Payer: Self-pay

## 2021-08-14 DIAGNOSIS — E538 Deficiency of other specified B group vitamins: Secondary | ICD-10-CM | POA: Diagnosis not present

## 2021-08-14 MED ORDER — CYANOCOBALAMIN 1000 MCG/ML IJ SOLN
1000.0000 ug | Freq: Once | INTRAMUSCULAR | Status: AC
  Administered 2021-08-14: 1000 ug via INTRAMUSCULAR

## 2021-08-14 NOTE — Progress Notes (Signed)
   B12 injection given per order, patient tolerated well. Today was the 2nd Injection of 4 weekly Injections.  Erie Noe, LPN [id number] AM

## 2021-08-21 ENCOUNTER — Ambulatory Visit (INDEPENDENT_AMBULATORY_CARE_PROVIDER_SITE_OTHER): Payer: Medicare PPO

## 2021-08-21 ENCOUNTER — Other Ambulatory Visit: Payer: Self-pay

## 2021-08-21 DIAGNOSIS — E538 Deficiency of other specified B group vitamins: Secondary | ICD-10-CM

## 2021-08-21 MED ORDER — CYANOCOBALAMIN 1000 MCG/ML IJ SOLN
1000.0000 ug | Freq: Once | INTRAMUSCULAR | Status: AC
  Administered 2021-08-21: 1000 ug via INTRAMUSCULAR

## 2021-08-21 NOTE — Progress Notes (Signed)
   B12 injection given per order, patient tolerated well.   Erie Noe, LPN [id number] AM

## 2021-08-28 ENCOUNTER — Ambulatory Visit (INDEPENDENT_AMBULATORY_CARE_PROVIDER_SITE_OTHER): Payer: Medicare PPO

## 2021-08-28 ENCOUNTER — Other Ambulatory Visit: Payer: Self-pay

## 2021-08-28 DIAGNOSIS — E538 Deficiency of other specified B group vitamins: Secondary | ICD-10-CM | POA: Diagnosis not present

## 2021-08-28 MED ORDER — CYANOCOBALAMIN 1000 MCG/ML IJ SOLN
1000.0000 ug | Freq: Once | INTRAMUSCULAR | Status: AC
Start: 2021-08-28 — End: 2021-08-28
  Administered 2021-08-28: 1000 ug via INTRAMUSCULAR

## 2021-08-28 NOTE — Progress Notes (Signed)
Patient is here for B12 injection per Dr Tobie Poet. She will come back in two weeks to recheck B12 levels. Patient tolerated shot well.

## 2021-09-07 ENCOUNTER — Encounter: Payer: Self-pay | Admitting: Family Medicine

## 2021-09-07 ENCOUNTER — Ambulatory Visit: Payer: Medicare PPO | Admitting: Family Medicine

## 2021-09-07 ENCOUNTER — Other Ambulatory Visit: Payer: Self-pay | Admitting: Physician Assistant

## 2021-09-07 VITALS — BP 110/64 | HR 84 | Temp 97.4°F | Resp 16 | Wt 111.0 lb

## 2021-09-07 DIAGNOSIS — J020 Streptococcal pharyngitis: Secondary | ICD-10-CM | POA: Diagnosis not present

## 2021-09-07 DIAGNOSIS — E538 Deficiency of other specified B group vitamins: Secondary | ICD-10-CM | POA: Diagnosis not present

## 2021-09-07 DIAGNOSIS — Z23 Encounter for immunization: Secondary | ICD-10-CM

## 2021-09-07 LAB — POC COVID19 BINAXNOW: SARS Coronavirus 2 Ag: POSITIVE — AB

## 2021-09-07 LAB — POCT RAPID STREP A (OFFICE): Rapid Strep A Screen: POSITIVE — AB

## 2021-09-07 MED ORDER — AMOXICILLIN 875 MG PO TABS: 875.0000 mg | ORAL_TABLET | Freq: Two times a day (BID) | ORAL | 0 refills | Status: AC

## 2021-09-07 NOTE — Progress Notes (Signed)
Acute Office Visit  Subjective:    Patient ID: Alexandria Jones, female    DOB: 1941/03/28, 80 y.o.   MRN: [id number]  Chief Complaint  Patient presents with   Sore Throat   Headache    HPI Patient is in today for fever, sore throat, headaches x 1 week. [place] which was negative and strep was positive.   B12 deficiency - B12 shots weekly x 4 weeks. Due to return in 2 days for recheck of b12 level.   Past Medical History:  Diagnosis Date   Breast cancer ([place]) 1990   cured S/P radiation treatment & chemo   GERD (gastroesophageal reflux disease)    Memory difficulty 03/06/2018   Migraines    Osteoarthritis     Past Surgical History:  Procedure Laterality Date   APPENDECTOMY     BREAST LUMPECTOMY     left lumpectomy  1990   TUBAL LIGATION     WRIST SURGERY      Family History  Problem Relation Age of Onset   Dementia Mother    Cancer Mother    Dementia Father    Depression Father    Cancer Brother    Cancer Sister     Social History   Socioeconomic History   Marital status: Married    Spouse name: Not on file   Number of children: Not on file   Years of education: Masters- education   Highest education level: Not on file  Occupational History   Not on file  Tobacco Use   Smoking status: Never   Smokeless tobacco: Never  Vaping Use   Vaping Use: Never used  Substance and Sexual Activity   Alcohol use: Yes    Comment: Rare- wine   Drug use: Never   Sexual activity: Not on file  Other Topics Concern   Not on file  Social History Narrative   Lives with husband   Caffeine use: sometimes tea   Right handed    Social Determinants of Health   Financial Resource Strain: Not on file  Food Insecurity: Not on file  Transportation Needs: Not on file  Physical Activity: Not on file  Stress: Not on file  Social Connections: Not on file  Intimate Partner Violence: Not on file    Outpatient Medications Prior to  Visit  Medication Sig Dispense Refill   acetaminophen (TYLENOL) 500 MG tablet Take 1,000 mg by mouth every 6 (six) hours as needed.     cyanocobalamin (,VITAMIN B-12,) 1000 MCG/ML injection INJECT 1 ML IN THE MUSCLE ONCE WEEKLY FOR 4 WEEKS 12 mL 0   memantine (NAMENDA) 10 MG tablet TAKE 1 TABLET(10 MG) BY MOUTH TWICE DAILY 180 tablet 1   venlafaxine XR (EFFEXOR-XR) 37.5 MG 24 hr capsule TAKE 1 CAPSULE(37.5 MG) BY MOUTH DAILY 90 capsule 0   No facility-administered medications prior to visit.    Allergies  Allergen Reactions   Macrobid [Nitrofurantoin] Nausea And Vomiting   Codeine Itching   Morphine Nausea Only    unknown unknown    Isosorbide Nitrate Other (See Comments)    unknown Other reaction(s): Other (See Comments) unknown     Review of Systems  Constitutional:  Negative for fever.  HENT:  Positive for sore throat.   Respiratory:  Negative for cough and shortness of breath.   Cardiovascular:  Negative for chest pain and palpitations.  Neurological:  Positive for headaches.      Objective:  Physical Exam Vitals reviewed.  Constitutional:      Appearance: Normal appearance. She is normal weight.  HENT:     Right Ear: Tympanic membrane, ear canal and external ear normal. Tympanic membrane is not erythematous.     Left Ear: Tympanic membrane, ear canal and external ear normal. Tympanic membrane is not erythematous.     Nose: Nose normal. No congestion.     Mouth/Throat:     Pharynx: Oropharynx is clear. Posterior oropharyngeal erythema present. No oropharyngeal exudate.  Cardiovascular:     Rate and Rhythm: Normal rate and regular rhythm.     Heart sounds: Normal heart sounds. No murmur heard. Pulmonary:     Effort: Pulmonary effort is normal. No respiratory distress.     Breath sounds: Normal breath sounds.  Neurological:     Mental Status: She is alert and oriented to person, place, and time.  Psychiatric:        Mood and Affect: Mood normal.         Behavior: Behavior normal.    BP 110/64   Pulse 84   Temp (!) 97.4 F (36.3 C)   Resp 16   Wt 111 lb (50.3 kg)   BMI 20.30 kg/m  Wt Readings from Last 3 Encounters:  09/07/21 111 lb (50.3 kg)  07/28/21 108 lb (49 kg)  06/16/21 110 lb (49.9 kg)    Health Maintenance Due  Topic Date Due   Zoster Vaccines- Shingrix (1 of 2) Never done   MAMMOGRAM  04/11/2021    There are no preventive care reminders to display for this patient.   Lab Results  Component Value Date   TSH 1.300 07/28/2021   Lab Results  Component Value Date   WBC 5.0 07/28/2021   HGB 15.4 07/28/2021   HCT 47.0 (H) 07/28/2021   MCV 90 07/28/2021   PLT 306 07/28/2021   Lab Results  Component Value Date   NA 139 07/28/2021   K 4.7 07/28/2021   CO2 24 07/28/2021   GLUCOSE 78 07/28/2021   BUN 11 07/28/2021   CREATININE 0.86 07/28/2021   BILITOT 0.3 07/28/2021   ALKPHOS 83 07/28/2021   AST 19 07/28/2021   ALT 13 07/28/2021   PROT 6.7 07/28/2021   ALBUMIN 4.5 07/28/2021   CALCIUM 10.0 07/28/2021   EGFR 68 07/28/2021   No results found for: CHOL No results found for: HDL No results found for: LDLCALC No results found for: TRIG No results found for: CHOLHDL No results found for: HGBA1C     Assessment & Plan:   Problem List Items Addressed This Visit       Respiratory   Strep pharyngitis - Primary    Start on amoxicillin.  Gargle warm salt water.  Tylenol of pain.       Relevant Orders   POCT rapid strep A (Completed)   POC COVID-19 (Completed)     Other   B12 deficiency    Improved.  Continue B12 1000 mcg IM monthly.       Relevant Orders   Vitamin B12 (Completed)   Other Visit Diagnoses     Need for influenza vaccination       Relevant Orders   Flu Vaccine QUAD High Dose(Fluad) (Completed)       Meds ordered this encounter  Medications   amoxicillin (AMOXIL) 875 MG tablet    Sig: Take 1 tablet (875 mg total) by mouth 2 (two) times daily for 10 days.    Dispense:  20 tablet    Refill:  0    Orders Placed This Encounter  Procedures   Flu Vaccine QUAD High Dose(Fluad)   Vitamin B12   POCT rapid strep A   POC COVID-19     Follow-up: No follow-ups on file.  An After Visit Summary was printed and given to the patient.  Rochel Brome, MD [facility] [phone]

## 2021-09-07 NOTE — Patient Instructions (Signed)
Amoxicillin sent to pharmacy.  Strep Throat, Adult Strep throat is an infection of the throat. It is caused by germs (bacteria). Strep throat is common during the cold months of the year. It mostly affects children who are 46-82 years old. However, people of all ages can get it at any time of the year. This infection spreads from person to person through coughing, sneezing, or having close contact. What are the causes? This condition is caused by the Streptococcus pyogenes germ. What increases the risk? 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 Children are more likely to get strep throat and may spread it to others. You go to crowded places. Germs can spread easily in such places. You kiss or touch someone who has strep throat. What are the signs or symptoms? Fever or chills. Redness, swelling, or pain in the tonsils or throat. Pain or trouble when swallowing. White or yellow spots on the tonsils or throat. Tender glands in the neck and under the jaw. Bad breath. Red rash all over the body. This is rare. How is this treated? Medicines that kill germs (antibiotics). Medicines that treat pain or fever. These include: Ibuprofen or acetaminophen. Aspirin, only for people who are over the age of 57. Cough drops. Throat sprays. Follow these instructions at home: Medicines  Take over-the-counter and prescription medicines only as told by your doctor. Take your antibiotic medicine as told by your doctor. Do not stop taking the antibiotic even if you start to feel better. Eating and drinking  If you have trouble swallowing, eat soft foods until your throat feels better. Drink enough fluid to keep your pee (urine) pale yellow. To help with pain, you may have: Warm fluids, such as soup and tea. Cold fluids, such as frozen desserts or popsicles. General instructions Rinse your mouth (gargle) with a salt-water mixture 3-4 times a day or as needed. To make a salt-water mixture, dissolve -1 tsp (3-6 g) of  salt in 1 cup (237 mL) of warm water. Rest as much as you can. Stay home from work or school until you have been taking antibiotics for 24 hours. Do not smoke or use any products that contain nicotine or tobacco. If you need help quitting, ask your doctor. Keep all follow-up visits. How is this prevented?  Do not share food, drinking cups, or personal items. They can cause the germs to spread. Wash your hands well with soap and water. Make sure that all people in your house wash their hands well. Have family members tested if they have a fever or a sore throat. They may need an antibiotic if they have strep throat. Contact a doctor if: You have swelling in your neck that keeps getting bigger. You get a rash, cough, or earache. You cough up a thick fluid that is green, yellow-brown, or bloody. You have pain that does not get better with medicine. Your symptoms get worse instead of getting better. You have a fever. Get help right away if: You vomit. You have a very bad headache. Your neck hurts or feels stiff. You have chest pain or are short of breath. You have drooling, very bad throat pain, or changes in your voice. Your neck is swollen, or the skin gets red and tender. Your mouth is dry, or you are peeing less than normal. You keep feeling more tired or have trouble waking up. Your joints are red or painful. These symptoms may be an emergency. Do not wait to see if the symptoms will go away.  Get help right away. Call your local emergency services (911 in the [place] Summary Strep throat is an infection of the throat. It is caused by germs (bacteria). This infection can spread from person to person through coughing, sneezing, or having close contact. Take your medicines, including antibiotics, as told by your doctor. Do not stop taking the antibiotic even if you start to feel better. To prevent the spread of germs, wash your hands well with soap and water. Have others do the same. Do  not share food, drinking cups, or personal items. Get help right away if you have a bad headache, chest pain, shortness of breath, a stiff or painful neck, or you vomit. This information is not intended to replace advice given to you by your health care provider. Make sure you discuss any questions you have with your health care provider. Document Revised: 03/24/2021 Document Reviewed: 03/24/2021 Elsevier Patient Education  2022 [employer].

## 2021-09-08 LAB — VITAMIN B12: Vitamin B-12: 628 pg/mL (ref 232–1245)

## 2021-09-11 ENCOUNTER — Other Ambulatory Visit: Payer: Medicare PPO

## 2021-09-15 DIAGNOSIS — L300 Nummular dermatitis: Secondary | ICD-10-CM | POA: Diagnosis not present

## 2021-09-16 ENCOUNTER — Encounter: Payer: Self-pay | Admitting: Family Medicine

## 2021-09-16 DIAGNOSIS — E538 Deficiency of other specified B group vitamins: Secondary | ICD-10-CM | POA: Insufficient documentation

## 2021-09-16 DIAGNOSIS — J020 Streptococcal pharyngitis: Secondary | ICD-10-CM | POA: Insufficient documentation

## 2021-09-16 NOTE — Assessment & Plan Note (Signed)
Start on amoxicillin.  Gargle warm salt water.  Tylenol of pain.

## 2021-09-16 NOTE — Assessment & Plan Note (Signed)
Improved.  Continue B12 1000 mcg IM monthly.

## 2021-09-18 ENCOUNTER — Telehealth: Payer: Self-pay

## 2021-09-18 NOTE — Telephone Encounter (Signed)
Pt's husband, [name] calling w/ Pt in background. States pt is continuing to have sore throat. Denies all other symptoms. Sore throat has improved. Pt finished antibiotic yesterday, questioning if she will need more.   Royce Macadamia, [place] 09/18/21 11:22 AM

## 2021-09-21 ENCOUNTER — Encounter: Payer: Self-pay | Admitting: Nurse Practitioner

## 2021-09-21 ENCOUNTER — Ambulatory Visit: Payer: Medicare PPO | Admitting: Nurse Practitioner

## 2021-09-21 VITALS — BP 118/76 | HR 74 | Temp 96.4°F | Ht 62.0 in | Wt 110.0 lb

## 2021-09-21 DIAGNOSIS — J020 Streptococcal pharyngitis: Secondary | ICD-10-CM

## 2021-09-21 DIAGNOSIS — J029 Acute pharyngitis, unspecified: Secondary | ICD-10-CM | POA: Diagnosis not present

## 2021-09-21 LAB — POC COVID19 BINAXNOW: SARS Coronavirus 2 Ag: NEGATIVE

## 2021-09-21 MED ORDER — AMOXICILLIN-POT CLAVULANATE 875-125 MG PO TABS
1.0000 | ORAL_TABLET | Freq: Two times a day (BID) | ORAL | 0 refills | Status: DC
Start: 2021-09-21 — End: 2021-10-13

## 2021-09-21 NOTE — Patient Instructions (Addendum)
Replace toothbrush and toothpaste Begin Augmentin twice daily for 10 days Warm salt water gargles several times daily Throat lozenges as needed Rest and push fluids Follow up as needed     Strep Throat, Adult Strep throat is an infection in the throat that is caused by bacteria. It is common during the cold months of the year. It mostly affects children who are 82-82 years old. However, people of all ages can get it at any time of the year. This infection spreads from person to person (is contagious) through coughing, sneezing, or having close contact. Your health care provider may use other names to describe the infection. It can be called tonsillitis (if there is swelling of the tonsils), or pharyngitis (if there is swelling at the back of the throat). What are the causes? This condition is caused by the Streptococcus pyogenes bacteria. What increases the risk? You are more likely to develop this condition if: You care for school-age children, or are around school-age children. Children are more likely to get strep throat and may spread it to others. You spend time in crowded places where the infection can spread easily. You have close contact with someone who has strep throat. What are the signs or symptoms? Symptoms of this condition include: Fever or chills. Redness, swelling, or pain in the tonsils or throat. Pain or difficulty when swallowing. White or yellow spots on the tonsils or throat. Tender glands in the neck and under the jaw. Bad smelling breath. Red rash all over the body. This is rare. How is this diagnosed? This condition is diagnosed by tests that check for the presence and the amount of bacteria that cause strep throat. They are: Rapid strep test. Your throat is swabbed and checked for the presence of bacteria. Results are usually ready in minutes. Throat culture test. Your throat is swabbed. The sample is placed in a cup that allows infections to grow. Results are  usually ready in 1 or 2 days. How is this treated? This condition may be treated with: Medicines that kill germs (antibiotics). Medicines that relieve pain or fever. These include: Ibuprofen or acetaminophen. Aspirin, only for patients who are over the age of 82. Throat lozenges. Throat sprays. Follow these instructions at home: Medicines  Take over-the-counter and prescription medicines only as told by your health care provider. Take your antibiotic medicine as told by your health care provider. Do not stop taking the antibiotic even if you start to feel better. Eating and drinking  If you have trouble swallowing, try eating soft foods until your sore throat feels better. Drink enough fluid to keep your urine pale yellow. To help relieve pain, you may have: Warm fluids, such as soup and tea. Cold fluids, such as frozen desserts or popsicles. General instructions Gargle with a salt-water mixture 3-4 times a day or as needed. To make a salt-water mixture, completely dissolve -1 tsp (3-6 g) of salt in 1 cup (237 mL) of warm water. Get plenty of rest. Stay home from work or school until you have been taking antibiotics for 24 hours. Avoid smoking or being around people who smoke. Keep all follow-up visits as told by your health care provider. This is important. How is this prevented?  Do not share food, drinking cups, or personal items that could cause the infection to spread to other people. Wash your hands well with soap and water, and make sure that all people in your house wash their hands well. Have family members tested  if they have a sore throat or fever. They may need an antibiotic if they have strep throat. Contact a health care provider if: The glands in your neck continue to get bigger. You develop a rash, cough, or earache. You cough up a thick mucus that is green, yellow-brown, or bloody. You have pain or discomfort that does not get better with medicine. Your symptoms  seem to be getting worse and not better. You have a fever. Get help right away if: You have new symptoms, such as vomiting, severe headache, stiff or painful neck, chest pain, or shortness of breath. You have severe throat pain, drooling, or changes in your voice. You have swelling of the neck, or the skin on the neck becomes red and tender. You have signs of dehydration, such as tiredness (fatigue), dry mouth, and decreased urination. You become increasingly sleepy, or you cannot wake up completely. Your joints become red or painful. Summary Strep throat is an infection in the throat that is caused by the Streptococcus pyogenes bacteria. This infection is spread from person to person (is contagious) through coughing, sneezing, or having close contact. Take your medicines, including antibiotics, as told by your health care provider. Do not stop taking the antibiotic even if you start to feel better. To prevent the spread of germs, wash your hands well with soap and water. Have others do the same. Do not share food, drinking cups, or personal items. Get help right away if you have new symptoms, such as vomiting, severe headache, stiff or painful neck, chest pain, or shortness of breath. This information is not intended to replace advice given to you by your health care provider. Make sure you discuss any questions you have with your health care provider. Document Revised: 02/16/2019 Document Reviewed: 02/16/2019 Elsevier Patient Education  [place].

## 2021-09-21 NOTE — Telephone Encounter (Signed)
Pt and husband were advised @1200  on 10/07.   Royce Macadamia, [place] 09/21/21 11:19 AM

## 2021-09-21 NOTE — Progress Notes (Signed)
]]  Acute Office Visit  Subjective:    Patient ID: Alexandria Jones, female    DOB: Sep 06, 1941, 80 y.o.   MRN: [id number]  Chief Complaint  Patient presents with   Sore Throat    HPI Patient is in today for sore throat was tx'd for strep on 09/26 with a course of Amoxicillin on 09/17/21. States she has persistent sore throat. Has also tried gargling with salt water which has not helped.Denies fever, chills, or difficulty swallowing.   Past Medical History:  Diagnosis Date   Breast cancer ([place]) 1990   cured S/P radiation treatment & chemo   GERD (gastroesophageal reflux disease)    Memory difficulty 03/06/2018   Migraines    Osteoarthritis     Past Surgical History:  Procedure Laterality Date   APPENDECTOMY     BREAST LUMPECTOMY     left lumpectomy  1990   TUBAL LIGATION     WRIST SURGERY      Family History  Problem Relation Age of Onset   Dementia Mother    Cancer Mother    Dementia Father    Depression Father    Cancer Brother    Cancer Sister     Social History   Socioeconomic History   Marital status: Married    Spouse name: Not on file   Number of children: Not on file   Years of education: Masters- education   Highest education level: Not on file  Occupational History   Not on file  Tobacco Use   Smoking status: Never   Smokeless tobacco: Never  Vaping Use   Vaping Use: Never used  Substance and Sexual Activity   Alcohol use: Yes    Comment: Rare- wine   Drug use: Never   Sexual activity: Not on file  Other Topics Concern   Not on file  Social History Narrative   Lives with husband   Caffeine use: sometimes tea   Right handed    Social Determinants of Health   Financial Resource Strain: Not on file  Food Insecurity: Not on file  Transportation Needs: Not on file  Physical Activity: Not on file  Stress: Not on file  Social Connections: Not on file  Intimate Partner Violence: Not on file    Outpatient Medications Prior to Visit   Medication Sig Dispense Refill   acetaminophen (TYLENOL) 500 MG tablet Take 1,000 mg by mouth every 6 (six) hours as needed.     cyanocobalamin (,VITAMIN B-12,) 1000 MCG/ML injection INJECT 1 ML IN THE MUSCLE ONCE WEEKLY FOR 4 WEEKS 12 mL 0   memantine (NAMENDA) 10 MG tablet TAKE 1 TABLET(10 MG) BY MOUTH TWICE DAILY 180 tablet 1   venlafaxine XR (EFFEXOR-XR) 37.5 MG 24 hr capsule TAKE 1 CAPSULE(37.5 MG) BY MOUTH DAILY 90 capsule 0   No facility-administered medications prior to visit.    Allergies  Allergen Reactions   Macrobid [Nitrofurantoin] Nausea And Vomiting   Codeine Itching   Morphine Nausea Only    unknown unknown    Isosorbide Nitrate Other (See Comments)    unknown Other reaction(s): Other (See Comments) unknown     Review of Systems  Constitutional:  Negative for chills, fatigue and fever.  HENT:  Positive for sore throat. Negative for congestion, ear pain, postnasal drip, rhinorrhea, sinus pressure and sinus pain.   Respiratory:  Negative for cough and shortness of breath.   Cardiovascular:  Negative for chest pain.  Gastrointestinal:  Negative for diarrhea and nausea.  Objective:    Physical Exam Vitals reviewed.  Constitutional:      Appearance: She is well-developed.  HENT:     Mouth/Throat:     Pharynx: Posterior oropharyngeal erythema present.     Tonsils: No tonsillar exudate or tonsillar abscesses.  Cardiovascular:     Rate and Rhythm: Normal rate and regular rhythm.     Pulses: Normal pulses.     Heart sounds: Normal heart sounds.  Pulmonary:     Effort: Pulmonary effort is normal.     Breath sounds: Normal breath sounds.  Skin:    General: Skin is warm and dry.     Capillary Refill: Capillary refill takes less than 2 seconds.  Neurological:     General: No focal deficit present.     Mental Status: She is alert and oriented to person, place, and time.    BP 118/76   Pulse 74   Temp (!) 96.4 F (35.8 C)   Ht _0  (1.575 m)   Wt  110 lb (49.9 kg)   SpO2 94%   BMI 20.12 kg/m   Wt Readings from Last 3 Encounters:  09/07/21 111 lb (50.3 kg)  07/28/21 108 lb (49 kg)  06/16/21 110 lb (49.9 kg)    Health Maintenance Due  Topic Date Due   Zoster Vaccines- Shingrix (1 of 2) Never done   MAMMOGRAM  04/11/2021       Lab Results  Component Value Date   TSH 1.300 07/28/2021   Lab Results  Component Value Date   WBC 5.0 07/28/2021   HGB 15.4 07/28/2021   HCT 47.0 (H) 07/28/2021   MCV 90 07/28/2021   PLT 306 07/28/2021   Lab Results  Component Value Date   NA 139 07/28/2021   K 4.7 07/28/2021   CO2 24 07/28/2021   GLUCOSE 78 07/28/2021   BUN 11 07/28/2021   CREATININE 0.86 07/28/2021   BILITOT 0.3 07/28/2021   ALKPHOS 83 07/28/2021   AST 19 07/28/2021   ALT 13 07/28/2021   PROT 6.7 07/28/2021   ALBUMIN 4.5 07/28/2021   CALCIUM 10.0 07/28/2021   EGFR 68 07/28/2021      Assessment & Plan:   1. Strep pharyngitis - amoxicillin-clavulanate (AUGMENTIN) 875-125 MG tablet; Take 1 tablet by mouth 2 (two) times daily.  Dispense: 20 tablet; Refill: 0  2. Sore throat - POC COVID-19 BinaxNow -NEGATIVE  Replace toothbrush and toothpaste Begin Augmentin twice daily for 10 days Warm salt water gargles several times daily Throat lozenges as needed Rest and push fluids Follow up as needed    Follow-up: PRN  An After Visit Summary was printed and given to the patient.  Rip Harbour, NP [place] [phone]

## 2021-10-02 ENCOUNTER — Other Ambulatory Visit: Payer: Self-pay

## 2021-10-02 ENCOUNTER — Ambulatory Visit (INDEPENDENT_AMBULATORY_CARE_PROVIDER_SITE_OTHER): Payer: Medicare PPO

## 2021-10-02 DIAGNOSIS — E538 Deficiency of other specified B group vitamins: Secondary | ICD-10-CM | POA: Diagnosis not present

## 2021-10-02 MED ORDER — CYANOCOBALAMIN 1000 MCG/ML IJ SOLN
1000.0000 ug | Freq: Once | INTRAMUSCULAR | Status: AC
Start: 2021-10-02 — End: 2021-10-02
  Administered 2021-10-02: 1000 ug via INTRAMUSCULAR

## 2021-10-02 NOTE — Progress Notes (Signed)
   B12 injection given per order, patient tolerated well. Per labwork done 09/07/21 she will receive monthly B12 injections.  Erie Noe, LPN 06:77 AM

## 2021-10-13 ENCOUNTER — Encounter: Payer: Self-pay | Admitting: Family Medicine

## 2021-10-13 ENCOUNTER — Ambulatory Visit: Payer: Medicare PPO | Admitting: Family Medicine

## 2021-10-13 VITALS — BP 94/70 | HR 80 | Temp 97.3°F | Resp 16 | Wt 109.2 lb

## 2021-10-13 DIAGNOSIS — J029 Acute pharyngitis, unspecified: Secondary | ICD-10-CM

## 2021-10-13 DIAGNOSIS — H6123 Impacted cerumen, bilateral: Secondary | ICD-10-CM

## 2021-10-13 LAB — POCT RAPID STREP A (OFFICE): Rapid Strep A Screen: NEGATIVE

## 2021-10-13 NOTE — Patient Instructions (Signed)
Pharyngitis ?Pharyngitis is a sore throat (pharynx). This is when there is redness, pain, and swelling in your throat. Most of the time, this condition gets better on its own. In some cases, you may need medicine. ?What are the causes? ?An infection from a virus. ?An infection from bacteria. ?Allergies. ?What increases the risk? ?Being 5-82 years old. ?Being in crowded environments. These include: ?Daycares. ?Schools. ?Dormitories. ?Living in a place with cold temperatures outside. ?Having a weakened disease-fighting (immune) system. ?What are the signs or symptoms? ?Symptoms may vary depending on the cause. Common symptoms include: ?Sore throat. ?Tiredness (fatigue). ?Low-grade fever. ?Stuffy nose. ?Cough. ?Headache. ?Other symptoms may include: ?Glands in the neck (lymph nodes) that are swollen. ?Skin rashes. ?Film on the throat or tonsils. This can be caused by an infection from bacteria. ?Vomiting. ?Red, itchy eyes. ?Loss of appetite. ?Joint pain and muscle aches. ?Tonsils that are temporarily bigger than usual (enlarged). ?How is this treated? ?Many times, treatment is not needed. This condition usually gets better in 3-4 days without treatment. ?If the infection is caused by a bacteria, you may be need to take antibiotics. ?Follow these instructions at home: ?Medicines ?Take over-the-counter and prescription medicines only as told by your doctor. ?If you were prescribed an antibiotic medicine, take it as told by your doctor. Do not stop taking the antibiotic even if you start to feel better. ?Use throat lozenges or sprays to soothe your throat as told by your doctor. ?Children can get pharyngitis. Do not give your child aspirin. ?Managing pain ?To help with pain, try: ?Sipping warm liquids, such as: ?Broth. ?Herbal tea. ?Warm water. ?Eating or drinking cold or frozen liquids, such as frozen ice pops. ?Rinsing your mouth (gargle) with a salt water mixture 3-4 times a day or as needed. ?To make salt water,  dissolve ?-1 tsp (3-6 g) of salt in 1 cup (237 mL) of warm water. ?Do not swallow this mixture. ?Sucking on hard candy or throat lozenges. ?Putting a cool-mist humidifier in your bedroom at night to moisten the air. ?Sitting in the bathroom with the door closed for 5-10 minutes while you run hot water in the shower. ? ?General instructions ? ?Do not smoke or use any products that contain nicotine or tobacco. If you need help quitting, ask your doctor. ?Rest as told by your doctor. ?Drink enough fluid to keep your pee (urine) pale yellow. ?How is this prevented? ?Wash your hands often for at least 20 seconds with soap and water. If soap and water are not available, use hand sanitizer. ?Do not touch your eyes, nose, or mouth with unwashed hands. Wash hands after touching these areas. ?Do not share cups or eating utensils. ?Avoid close contact with people who are sick. ?Contact a doctor if: ?You have large, tender lumps in your neck. ?You have a rash. ?You cough up green, yellow-brown, or bloody spit. ?Get help right away if: ?You have a stiff neck. ?You drool or cannot swallow liquids. ?You cannot drink or take medicines without vomiting. ?You have very bad pain that does not go away with medicine. ?You have problems breathing, and it is not from a stuffy nose. ?You have new pain and swelling in your knees, ankles, wrists, or elbows. ?These symptoms may be an emergency. Get help right away. Call your local emergency services (911 in the [place] ?Do not wait to see if the symptoms will go away. ?Do not drive yourself to the hospital. ?Summary ?Pharyngitis is a sore throat (pharynx). This is   when there is redness, pain, and swelling in your throat. ?Most of the time, pharyngitis gets better on its own. Sometimes, you may need medicine. ?If you were prescribed an antibiotic medicine, take it as told by your doctor. Do not stop taking the antibiotic even if you start to feel better. ?This information is not intended to  replace advice given to you by your health care provider. Make sure you discuss any questions you have with your health care provider. ?Document Revised: 02/25/2021 Document Reviewed: 02/25/2021 ?Elsevier Patient Education ? 2022 Elsevier Inc. ? ?

## 2021-10-13 NOTE — Progress Notes (Signed)
Acute Office Visit  Subjective:    Patient ID: Alexandria Jones, female    DOB: 03/17/1941, 80 y.o.   MRN: [id number]  Chief Complaint  Patient presents with   Sore Throat    HPI: Patient is in today for ST yesterday. No fever, chills, sweats, earaches.   Past Medical History:  Diagnosis Date   Breast cancer ([place]) 1990   cured S/P radiation treatment & chemo   GERD (gastroesophageal reflux disease)    Memory difficulty 03/06/2018   Migraines    Osteoarthritis     Past Surgical History:  Procedure Laterality Date   APPENDECTOMY     BREAST LUMPECTOMY     left lumpectomy  1990   TUBAL LIGATION     WRIST SURGERY      Family History  Problem Relation Age of Onset   Dementia Mother    Cancer Mother    Dementia Father    Depression Father    Cancer Brother    Cancer Sister     Social History   Socioeconomic History   Marital status: Married    Spouse name: Not on file   Number of children: Not on file   Years of education: Masters- education   Highest education level: Not on file  Occupational History   Not on file  Tobacco Use   Smoking status: Never   Smokeless tobacco: Never  Vaping Use   Vaping Use: Never used  Substance and Sexual Activity   Alcohol use: Yes    Comment: Rare- wine   Drug use: Never   Sexual activity: Not on file  Other Topics Concern   Not on file  Social History Narrative   Lives with husband   Caffeine use: sometimes tea   Right handed    Social Determinants of Health   Financial Resource Strain: Not on file  Food Insecurity: Not on file  Transportation Needs: Not on file  Physical Activity: Not on file  Stress: Not on file  Social Connections: Not on file  Intimate Partner Violence: Not on file    Outpatient Medications Prior to Visit  Medication Sig Dispense Refill   acetaminophen (TYLENOL) 500 MG tablet Take 1,000 mg by mouth every 6 (six) hours as needed.     cyanocobalamin (,VITAMIN B-12,) 1000 MCG/ML  injection INJECT 1 ML IN THE MUSCLE ONCE WEEKLY FOR 4 WEEKS 12 mL 0   memantine (NAMENDA) 10 MG tablet TAKE 1 TABLET(10 MG) BY MOUTH TWICE DAILY 180 tablet 1   venlafaxine XR (EFFEXOR-XR) 37.5 MG 24 hr capsule TAKE 1 CAPSULE(37.5 MG) BY MOUTH DAILY 90 capsule 0   amoxicillin-clavulanate (AUGMENTIN) 875-125 MG tablet Take 1 tablet by mouth 2 (two) times daily. 20 tablet 0   No facility-administered medications prior to visit.    Allergies  Allergen Reactions   Macrobid [Nitrofurantoin] Nausea And Vomiting   Codeine Itching   Morphine Nausea Only    unknown unknown    Isosorbide Nitrate Other (See Comments)    unknown Other reaction(s): Other (See Comments) unknown     Review of Systems  Constitutional:  Negative for fatigue and fever.  HENT:  Positive for sore throat. Negative for congestion and rhinorrhea.   Respiratory:  Negative for cough.   Cardiovascular:  Negative for chest pain.  Neurological:  Negative for dizziness.      Objective:    Physical Exam Vitals reviewed.  Constitutional:      Appearance: Normal appearance. She is normal weight.  HENT:     Right Ear: There is impacted cerumen.     Left Ear: There is impacted cerumen.     Nose: Nose normal.     Mouth/Throat:     Pharynx: Oropharynx is clear. Posterior oropharyngeal erythema (mild) present.  Neck:     Vascular: No carotid bruit.  Cardiovascular:     Rate and Rhythm: Normal rate and regular rhythm.     Heart sounds: Normal heart sounds. No murmur heard. Pulmonary:     Effort: Pulmonary effort is normal. No respiratory distress.     Breath sounds: Normal breath sounds.  Neurological:     Mental Status: She is alert.    BP 94/70   Pulse 80   Temp (!) 97.3 F (36.3 C)   Resp 16   Wt 109 lb 3.2 oz (49.5 kg)   BMI 19.97 kg/m  Wt Readings from Last 3 Encounters:  10/13/21 109 lb 3.2 oz (49.5 kg)  09/21/21 110 lb (49.9 kg)  09/07/21 111 lb (50.3 kg)    Health Maintenance Due  Topic Date  Due   Zoster Vaccines- Shingrix (1 of 2) Never done   MAMMOGRAM  04/11/2021   COVID-19 Vaccine (5 - Booster for Pfizer series) 06/29/2021    There are no preventive care reminders to display for this patient.   Lab Results  Component Value Date   TSH 1.300 07/28/2021   Lab Results  Component Value Date   WBC 5.0 07/28/2021   HGB 15.4 07/28/2021   HCT 47.0 (H) 07/28/2021   MCV 90 07/28/2021   PLT 306 07/28/2021   Lab Results  Component Value Date   NA 139 07/28/2021   K 4.7 07/28/2021   CO2 24 07/28/2021   GLUCOSE 78 07/28/2021   BUN 11 07/28/2021   CREATININE 0.86 07/28/2021   BILITOT 0.3 07/28/2021   ALKPHOS 83 07/28/2021   AST 19 07/28/2021   ALT 13 07/28/2021   PROT 6.7 07/28/2021   ALBUMIN 4.5 07/28/2021   CALCIUM 10.0 07/28/2021   EGFR 68 07/28/2021   No results found for: CHOL No results found for: HDL No results found for: LDLCALC No results found for: TRIG No results found for: CHOLHDL No results found for: HGBA1C     Assessment & Plan:   Problem List Items Addressed This Visit       Respiratory   Acute viral pharyngitis - Primary    Strep negative.  Gargle warm salt water.  Push fluids.       Relevant Orders   POCT rapid strep A (Completed)     Nervous and Auditory   Bilateral impacted cerumen    BL Ears irrigation.       No orders of the defined types were placed in this encounter.   Orders Placed This Encounter  Procedures   POCT rapid strep A     Follow-up: No follow-ups on file.  An After Visit Summary was printed and given to the patient.  Rochel Brome, MD [facility] [phone]

## 2021-10-17 ENCOUNTER — Encounter: Payer: Self-pay | Admitting: Family Medicine

## 2021-10-17 DIAGNOSIS — J029 Acute pharyngitis, unspecified: Secondary | ICD-10-CM | POA: Insufficient documentation

## 2021-10-17 DIAGNOSIS — H6123 Impacted cerumen, bilateral: Secondary | ICD-10-CM | POA: Insufficient documentation

## 2021-10-17 NOTE — Assessment & Plan Note (Signed)
BL Ears irrigation.

## 2021-10-17 NOTE — Assessment & Plan Note (Signed)
Strep negative.  Gargle warm salt water.  Push fluids.

## 2021-10-30 ENCOUNTER — Ambulatory Visit (INDEPENDENT_AMBULATORY_CARE_PROVIDER_SITE_OTHER): Payer: Medicare PPO

## 2021-10-30 DIAGNOSIS — E538 Deficiency of other specified B group vitamins: Secondary | ICD-10-CM

## 2021-10-30 MED ORDER — CYANOCOBALAMIN 1000 MCG/ML IJ SOLN
1000.0000 ug | Freq: Once | INTRAMUSCULAR | Status: AC
  Administered 2021-10-30: 1000 ug via INTRAMUSCULAR

## 2021-10-30 NOTE — Progress Notes (Signed)
Pt in office for monthly b12 injection. Pt tolerated in L deltoid.   Alexandria Jones

## 2021-11-04 ENCOUNTER — Ambulatory Visit: Payer: Medicare PPO | Admitting: Family Medicine

## 2021-11-04 ENCOUNTER — Other Ambulatory Visit: Payer: Self-pay

## 2021-11-04 VITALS — BP 106/78 | HR 80 | Temp 97.1°F | Resp 16 | Ht 62.0 in | Wt 111.0 lb

## 2021-11-04 DIAGNOSIS — E538 Deficiency of other specified B group vitamins: Secondary | ICD-10-CM

## 2021-11-04 DIAGNOSIS — F411 Generalized anxiety disorder: Secondary | ICD-10-CM

## 2021-11-04 DIAGNOSIS — G301 Alzheimer's disease with late onset: Secondary | ICD-10-CM

## 2021-11-04 DIAGNOSIS — F0280 Dementia in other diseases classified elsewhere without behavioral disturbance: Secondary | ICD-10-CM | POA: Diagnosis not present

## 2021-11-04 MED ORDER — MEMANTINE HCL 10 MG PO TABS
ORAL_TABLET | ORAL | 3 refills | Status: DC
Start: 2021-11-04 — End: 2021-11-06

## 2021-11-04 NOTE — Progress Notes (Signed)
Subjective:  Patient ID: Alexandria Jones, female    DOB: June 27, 1941  Age: 80 y.o. MRN: [id number]  Chief Complaint  Patient presents with   Vitamin B12 defiency   Dementia    HPI  Alzheimer's dementia: Patient forgets grandchildren's names. She is able to perform ADLs. Her husband is caretaker. Patient is taking namenda.  Vitamin B12 deficiency: Patient  is taking B12  shots monthly.  GAD: She is taking Effexor XR. Anxiety is well controlled.  Current Outpatient Medications on File Prior to Visit  Medication Sig Dispense Refill   acetaminophen (TYLENOL) 500 MG tablet Take 1,000 mg by mouth every 6 (six) hours as needed.     cyanocobalamin (,VITAMIN B-12,) 1000 MCG/ML injection INJECT 1 ML IN THE MUSCLE ONCE WEEKLY FOR 4 WEEKS 12 mL 0   venlafaxine XR (EFFEXOR-XR) 37.5 MG 24 hr capsule TAKE 1 CAPSULE(37.5 MG) BY MOUTH DAILY 90 capsule 0   No current facility-administered medications on file prior to visit.   Past Medical History:  Diagnosis Date   Breast cancer ([place]) 1990   cured S/P radiation treatment & chemo   GERD (gastroesophageal reflux disease)    Memory difficulty 03/06/2018   Migraines    Osteoarthritis    Past Surgical History:  Procedure Laterality Date   APPENDECTOMY     BREAST LUMPECTOMY     left lumpectomy  1990   TUBAL LIGATION     WRIST SURGERY      Family History  Problem Relation Age of Onset   Dementia Mother    Cancer Mother    Dementia Father    Depression Father    Cancer Brother    Cancer Sister    Social History   Socioeconomic History   Marital status: Married    Spouse name: Not on file   Number of children: Not on file   Years of education: Masters- education   Highest education level: Not on file  Occupational History   Not on file  Tobacco Use   Smoking status: Never   Smokeless tobacco: Never  Vaping Use   Vaping Use: Never used  Substance and Sexual Activity   Alcohol use: Yes    Comment: Rare- wine   Drug use:  Never   Sexual activity: Not on file  Other Topics Concern   Not on file  Social History Narrative   Lives with husband   Caffeine use: sometimes tea   Right handed    Social Determinants of Health   Financial Resource Strain: Not on file  Food Insecurity: Not on file  Transportation Needs: Not on file  Physical Activity: Not on file  Stress: Not on file  Social Connections: Not on file    Review of Systems  Constitutional:  Negative for chills, fatigue and fever.  HENT:  Positive for sore throat. Negative for congestion and rhinorrhea.   Respiratory:  Negative for cough and shortness of breath.   Cardiovascular:  Negative for chest pain.  Gastrointestinal:  Negative for abdominal pain, constipation, diarrhea, nausea and vomiting.  Genitourinary:  Negative for dysuria and urgency.  Musculoskeletal:  Positive for arthralgias (hand pain). Negative for back pain and myalgias.  Neurological:  Positive for headaches. Negative for dizziness, weakness and light-headedness.  Psychiatric/Behavioral:  Negative for dysphoric mood. The patient is not nervous/anxious.     Objective:  BP 106/78   Pulse 80   Temp (!) 97.1 F (36.2 C)   Resp 16   Ht 5\' 2"  (1.575  m)   Wt 111 lb (50.3 kg)   BMI 20.30 kg/m   BP/Weight 11/09/2021 11/04/2021 97/08/4800  Systolic BP 655 374 94  Diastolic BP 60 78 70  Wt. (Lbs) 110 111 109.2  BMI 20.12 20.3 19.97    Physical Exam Vitals reviewed.  Constitutional:      Appearance: Normal appearance.  HENT:     Right Ear: Tympanic membrane normal.     Left Ear: Tympanic membrane normal.     Nose: Nose normal.     Mouth/Throat:     Pharynx: No oropharyngeal exudate or posterior oropharyngeal erythema.  Neck:     Vascular: No carotid bruit.  Cardiovascular:     Rate and Rhythm: Normal rate and regular rhythm.     Pulses: Normal pulses.     Heart sounds: Normal heart sounds.  Pulmonary:     Effort: Pulmonary effort is normal.     Breath sounds:  Normal breath sounds.  Abdominal:     General: Bowel sounds are normal.     Palpations: Abdomen is soft.     Tenderness: There is no abdominal tenderness.  Lymphadenopathy:     Cervical: No cervical adenopathy.  Skin:    Findings: No lesion or rash.  Neurological:     Mental Status: She is alert and oriented to person, place, and time.  Psychiatric:        Mood and Affect: Mood normal.        Behavior: Behavior normal.    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 I sent refill of namenda.        Other   B12 deficiency - Primary    The current medical regimen is effective;  continue present plan and medications, B12 shots.       Relevant Orders   Vitamin B12 (Completed)   GAD (generalized anxiety disorder)    Continue with Effexor XR.      .  Orders Placed This Encounter  Procedures   Vitamin B12    I,Lenore Moyano,acting as a scribe for Rochel Brome, MD.,have documented all relevant documentation on the behalf of Rochel Brome, MD,as directed by  Rochel Brome, MD while in the presence of Rochel Brome, MD.  Follow-up: Return if symptoms worsen or fail to improve.  An After Visit Summary was printed and given to the patient.  Rochel Brome, MD [facility] [phone]

## 2021-11-05 LAB — VITAMIN B12: Vitamin B-12: 679 pg/mL (ref 232–1245)

## 2021-11-06 ENCOUNTER — Other Ambulatory Visit: Payer: Self-pay | Admitting: Family Medicine

## 2021-11-09 ENCOUNTER — Ambulatory Visit: Payer: Medicare PPO | Admitting: Family Medicine

## 2021-11-09 ENCOUNTER — Encounter: Payer: Self-pay | Admitting: Family Medicine

## 2021-11-09 VITALS — BP 110/60 | HR 60 | Temp 97.4°F | Resp 16 | Wt 110.0 lb

## 2021-11-09 DIAGNOSIS — J029 Acute pharyngitis, unspecified: Secondary | ICD-10-CM | POA: Diagnosis not present

## 2021-11-09 DIAGNOSIS — J301 Allergic rhinitis due to pollen: Secondary | ICD-10-CM | POA: Diagnosis not present

## 2021-11-09 LAB — POCT RAPID STREP A (OFFICE): Rapid Strep A Screen: NEGATIVE

## 2021-11-09 NOTE — Progress Notes (Signed)
Acute Office Visit  Subjective:    Patient ID: Alexandria Jones, female    DOB: 28-Nov-1941, 80 y.o.   MRN: [id number]  Chief Complaint  Patient presents with   Headache    HPI: Patient is in today for a sore throat and a headache.  Over the last 3 to 4 months she has had a sore throat several times.  Twice she has had strep throat in the rest of the time she was negative for this.  Today she is negative for strep throat.  Past Medical History:  Diagnosis Date   Breast cancer ([place]) 1990   cured S/P radiation treatment & chemo   GERD (gastroesophageal reflux disease)    Memory difficulty 03/06/2018   Migraines    Osteoarthritis     Past Surgical History:  Procedure Laterality Date   APPENDECTOMY     BREAST LUMPECTOMY     left lumpectomy  1990   TUBAL LIGATION     WRIST SURGERY      Family History  Problem Relation Age of Onset   Dementia Mother    Cancer Mother    Dementia Father    Depression Father    Cancer Brother    Cancer Sister     Social History   Socioeconomic History   Marital status: Married    Spouse name: Not on file   Number of children: Not on file   Years of education: Masters- education   Highest education level: Not on file  Occupational History   Not on file  Tobacco Use   Smoking status: Never   Smokeless tobacco: Never  Vaping Use   Vaping Use: Never used  Substance and Sexual Activity   Alcohol use: Yes    Comment: Rare- wine   Drug use: Never   Sexual activity: Not on file  Other Topics Concern   Not on file  Social History Narrative   Lives with husband   Caffeine use: sometimes tea   Right handed    Social Determinants of Health   Financial Resource Strain: Not on file  Food Insecurity: Not on file  Transportation Needs: Not on file  Physical Activity: Not on file  Stress: Not on file  Social Connections: Not on file  Intimate Partner Violence: Not on file    Outpatient Medications Prior to Visit  Medication  Sig Dispense Refill   acetaminophen (TYLENOL) 500 MG tablet Take 1,000 mg by mouth every 6 (six) hours as needed.     cyanocobalamin (,VITAMIN B-12,) 1000 MCG/ML injection INJECT 1 ML IN THE MUSCLE ONCE WEEKLY FOR 4 WEEKS 12 mL 0   memantine (NAMENDA) 10 MG tablet TAKE 1 TABLET(10 MG) BY MOUTH TWICE DAILY 180 tablet 3   venlafaxine XR (EFFEXOR-XR) 37.5 MG 24 hr capsule TAKE 1 CAPSULE(37.5 MG) BY MOUTH DAILY 90 capsule 0   No facility-administered medications prior to visit.    Allergies  Allergen Reactions   Macrobid [Nitrofurantoin] Nausea And Vomiting   Codeine Itching   Morphine Nausea Only    unknown unknown    Isosorbide Nitrate Other (See Comments)    unknown Other reaction(s): Other (See Comments) unknown     Review of Systems  Constitutional:  Negative for chills and fever.  HENT:  Positive for sore throat. Negative for congestion and ear pain.   Respiratory:  Negative for cough.   Neurological:  Positive for headaches.      Objective:    Physical Exam Vitals reviewed.  Constitutional:      Appearance: Normal appearance. She is well-developed and normal weight.  HENT:     Right Ear: Tympanic membrane, ear canal and external ear normal.     Left Ear: Tympanic membrane, ear canal and external ear normal.     Nose: Congestion and rhinorrhea present.     Mouth/Throat:     Pharynx: No oropharyngeal exudate or posterior oropharyngeal erythema.  Cardiovascular:     Rate and Rhythm: Normal rate and regular rhythm.     Heart sounds: Normal heart sounds. No murmur heard. Pulmonary:     Effort: Pulmonary effort is normal. No respiratory distress.     Breath sounds: Normal breath sounds.  Lymphadenopathy:     Cervical: No cervical adenopathy.  Neurological:     Mental Status: She is alert.  Psychiatric:        Mood and Affect: Mood normal.        Behavior: Behavior normal.    BP 110/60   Pulse 60   Temp (!) 97.4 F (36.3 C)   Resp 16   Wt 110 lb (49.9 kg)    BMI 20.12 kg/m  Wt Readings from Last 3 Encounters:  11/09/21 110 lb (49.9 kg)  11/04/21 111 lb (50.3 kg)  10/13/21 109 lb 3.2 oz (49.5 kg)    Health Maintenance Due  Topic Date Due   Zoster Vaccines- Shingrix (1 of 2) Never done   MAMMOGRAM  04/11/2021    There are no preventive care reminders to display for this patient.   Lab Results  Component Value Date   TSH 1.300 07/28/2021   Lab Results  Component Value Date   WBC 5.0 07/28/2021   HGB 15.4 07/28/2021   HCT 47.0 (H) 07/28/2021   MCV 90 07/28/2021   PLT 306 07/28/2021   Lab Results  Component Value Date   NA 139 07/28/2021   K 4.7 07/28/2021   CO2 24 07/28/2021   GLUCOSE 78 07/28/2021   BUN 11 07/28/2021   CREATININE 0.86 07/28/2021   BILITOT 0.3 07/28/2021   ALKPHOS 83 07/28/2021   AST 19 07/28/2021   ALT 13 07/28/2021   PROT 6.7 07/28/2021   ALBUMIN 4.5 07/28/2021   CALCIUM 10.0 07/28/2021   EGFR 68 07/28/2021   No results found for: CHOL No results found for: HDL No results found for: LDLCALC No results found for: TRIG No results found for: CHOLHDL No results found for: HGBA1C     Assessment & Plan:   Problem List Items Addressed This Visit       Respiratory   Non-seasonal allergic rhinitis due to pollen - Primary    Trial on zyrtec. Few samples given.       Pharyngitis    Ddx: allergic rhinitis causing PND or possibly GERD.      Relevant Orders   POCT rapid strep A (Completed)   No orders of the defined types were placed in this encounter.   Orders Placed This Encounter  Procedures   POCT rapid strep A     Follow-up: Return if symptoms worsen or fail to improve.  An After Visit Summary was printed and given to the patient.  Rochel Brome, MD [facility] [phone]

## 2021-11-09 NOTE — Assessment & Plan Note (Signed)
Ddx: allergic rhinitis causing PND or possibly GERD.

## 2021-11-09 NOTE — Assessment & Plan Note (Signed)
Trial on zyrtec. Few samples given.

## 2021-11-15 DIAGNOSIS — F411 Generalized anxiety disorder: Secondary | ICD-10-CM | POA: Insufficient documentation

## 2021-11-15 NOTE — Assessment & Plan Note (Signed)
The current medical regimen is effective;  continue present plan and medications, B12 shots.

## 2021-11-15 NOTE — Assessment & Plan Note (Signed)
Little worse. I sent refill of namenda.

## 2021-11-15 NOTE — Assessment & Plan Note (Signed)
Continue with Effexor XR.

## 2021-11-21 ENCOUNTER — Encounter: Payer: Self-pay | Admitting: Family Medicine

## 2021-11-27 ENCOUNTER — Ambulatory Visit (INDEPENDENT_AMBULATORY_CARE_PROVIDER_SITE_OTHER): Payer: Medicare PPO

## 2021-11-27 DIAGNOSIS — E538 Deficiency of other specified B group vitamins: Secondary | ICD-10-CM | POA: Diagnosis not present

## 2021-11-27 MED ORDER — CYANOCOBALAMIN 1000 MCG/ML IJ SOLN
1000.0000 ug | Freq: Once | INTRAMUSCULAR | Status: AC
  Administered 2021-11-27: 1000 ug via INTRAMUSCULAR

## 2021-11-27 NOTE — Progress Notes (Signed)
Patient in office for b12 injection.   Alexandria Jones, [place] 11/27/21 10:14 AM

## 2021-12-02 ENCOUNTER — Other Ambulatory Visit: Payer: Self-pay | Admitting: Physician Assistant

## 2021-12-28 ENCOUNTER — Ambulatory Visit (INDEPENDENT_AMBULATORY_CARE_PROVIDER_SITE_OTHER): Payer: Medicare PPO

## 2021-12-28 ENCOUNTER — Other Ambulatory Visit: Payer: Self-pay

## 2021-12-28 DIAGNOSIS — E538 Deficiency of other specified B group vitamins: Secondary | ICD-10-CM | POA: Diagnosis not present

## 2021-12-28 MED ORDER — CYANOCOBALAMIN 1000 MCG/ML IJ SOLN: 1000.0000 ug | INTRAMUSCULAR | 0 refills | Status: AC

## 2021-12-28 MED ORDER — CYANOCOBALAMIN 1000 MCG/ML IJ SOLN
1000.0000 ug | Freq: Once | INTRAMUSCULAR | Status: AC
  Administered 2021-12-28: 1000 ug via INTRAMUSCULAR

## 2021-12-28 NOTE — Progress Notes (Signed)
° °  Monthly B12 injection given per order, patient tolerated well.   Erie Noe, LPN 2:50 PM

## 2022-01-22 ENCOUNTER — Ambulatory Visit (INDEPENDENT_AMBULATORY_CARE_PROVIDER_SITE_OTHER): Payer: Medicare PPO

## 2022-01-22 DIAGNOSIS — E538 Deficiency of other specified B group vitamins: Secondary | ICD-10-CM

## 2022-01-22 MED ORDER — CYANOCOBALAMIN 1000 MCG/ML IJ SOLN
1000.0000 ug | Freq: Once | INTRAMUSCULAR | Status: AC
  Administered 2022-01-22: 1000 ug via INTRAMUSCULAR

## 2022-01-22 NOTE — Progress Notes (Signed)
Patient in office for b12 injection. Tolerated in Left deltoid.   Royce Macadamia, [place] 01/22/22 10:06 AM

## 2022-02-05 DIAGNOSIS — L299 Pruritus, unspecified: Secondary | ICD-10-CM | POA: Diagnosis not present

## 2022-02-05 DIAGNOSIS — B353 Tinea pedis: Secondary | ICD-10-CM | POA: Diagnosis not present

## 2022-02-05 DIAGNOSIS — B359 Dermatophytosis, unspecified: Secondary | ICD-10-CM | POA: Diagnosis not present

## 2022-02-05 DIAGNOSIS — D485 Neoplasm of uncertain behavior of skin: Secondary | ICD-10-CM | POA: Diagnosis not present

## 2022-02-19 DIAGNOSIS — B353 Tinea pedis: Secondary | ICD-10-CM | POA: Diagnosis not present

## 2022-02-19 DIAGNOSIS — Z79899 Other long term (current) drug therapy: Secondary | ICD-10-CM | POA: Diagnosis not present

## 2022-02-19 DIAGNOSIS — B351 Tinea unguium: Secondary | ICD-10-CM | POA: Diagnosis not present

## 2022-02-23 ENCOUNTER — Ambulatory Visit (INDEPENDENT_AMBULATORY_CARE_PROVIDER_SITE_OTHER): Payer: Medicare PPO

## 2022-02-23 DIAGNOSIS — E538 Deficiency of other specified B group vitamins: Secondary | ICD-10-CM | POA: Diagnosis not present

## 2022-02-23 MED ORDER — CYANOCOBALAMIN 1000 MCG/ML IJ SOLN
1000.0000 ug | Freq: Once | INTRAMUSCULAR | Status: AC
  Administered 2022-02-23: 1000 ug via INTRAMUSCULAR

## 2022-03-02 ENCOUNTER — Other Ambulatory Visit: Payer: Self-pay | Admitting: Physician Assistant

## 2022-03-19 DIAGNOSIS — G8929 Other chronic pain: Secondary | ICD-10-CM | POA: Diagnosis not present

## 2022-03-19 DIAGNOSIS — B351 Tinea unguium: Secondary | ICD-10-CM | POA: Insufficient documentation

## 2022-03-19 DIAGNOSIS — M79674 Pain in right toe(s): Secondary | ICD-10-CM | POA: Diagnosis not present

## 2022-03-19 DIAGNOSIS — M79675 Pain in left toe(s): Secondary | ICD-10-CM | POA: Diagnosis not present

## 2022-03-22 ENCOUNTER — Ambulatory Visit (INDEPENDENT_AMBULATORY_CARE_PROVIDER_SITE_OTHER): Payer: Medicare PPO

## 2022-03-22 DIAGNOSIS — E538 Deficiency of other specified B group vitamins: Secondary | ICD-10-CM

## 2022-03-22 MED ORDER — CYANOCOBALAMIN 1000 MCG/ML IJ SOLN
1000.0000 ug | Freq: Once | INTRAMUSCULAR | Status: AC
  Administered 2022-03-22: 1000 ug via INTRAMUSCULAR

## 2022-03-25 DIAGNOSIS — Z961 Presence of intraocular lens: Secondary | ICD-10-CM | POA: Diagnosis not present

## 2022-03-25 DIAGNOSIS — H26493 Other secondary cataract, bilateral: Secondary | ICD-10-CM | POA: Diagnosis not present

## 2022-03-25 DIAGNOSIS — L821 Other seborrheic keratosis: Secondary | ICD-10-CM | POA: Diagnosis not present

## 2022-03-25 DIAGNOSIS — H43393 Other vitreous opacities, bilateral: Secondary | ICD-10-CM | POA: Diagnosis not present

## 2022-04-15 ENCOUNTER — Ambulatory Visit (INDEPENDENT_AMBULATORY_CARE_PROVIDER_SITE_OTHER): Payer: Medicare PPO

## 2022-04-15 DIAGNOSIS — E538 Deficiency of other specified B group vitamins: Secondary | ICD-10-CM

## 2022-04-15 MED ORDER — CYANOCOBALAMIN 1000 MCG/ML IJ SOLN
1000.0000 ug | Freq: Once | INTRAMUSCULAR | Status: AC
  Administered 2022-04-15: 1000 ug via INTRAMUSCULAR

## 2022-04-15 NOTE — Progress Notes (Signed)
Patient came for vitamin B12 shot. Patient tolerated well the shot. ?

## 2022-05-04 ENCOUNTER — Ambulatory Visit: Payer: Medicare PPO | Admitting: Emergency Medicine

## 2022-05-04 ENCOUNTER — Encounter: Payer: Self-pay | Admitting: Emergency Medicine

## 2022-05-04 DIAGNOSIS — Z0000 Encounter for general adult medical examination without abnormal findings: Secondary | ICD-10-CM | POA: Diagnosis not present

## 2022-05-04 NOTE — Patient Instructions (Addendum)
- Consider installing grab bars in bathroom - Please take advanced directives - living will, health care power of attorney, power of attorney to Dr. Alyse Low office  Fall Prevention in the Home, Adult Falls can cause injuries and can happen to people of all ages. There are many things you can do to make your home safe and to help prevent falls. Ask for help when making these changes. What actions can I take to prevent falls? General Instructions Use good lighting in all rooms. Replace any light bulbs that burn out. Turn on the lights in dark areas. Use night-lights. Keep items that you use often in easy-to-reach places. Lower the shelves around your home if needed. Set up your furniture so you have a clear path. Avoid moving your furniture around. Do not have throw rugs or other things on the floor that can make you trip. Avoid walking on wet floors. If any of your floors are uneven, fix them. Add color or contrast paint or tape to clearly mark and help you see: Grab bars or handrails. First and last steps of staircases. Where the edge of each step is. If you use a stepladder: Make sure that it is fully opened. Do not climb a closed stepladder. Make sure the sides of the stepladder are locked in place. Ask someone to hold the stepladder while you use it. Know where your pets are when moving through your home. What can I do in the bathroom?     Keep the floor dry. Clean up any water on the floor right away. Remove soap buildup in the tub or shower. Use nonskid mats or decals on the floor of the tub or shower. Attach bath mats securely with double-sided, nonslip rug tape. If you need to sit down in the shower, use a plastic, nonslip stool. Install grab bars by the toilet and in the tub and shower. Do not use towel bars as grab bars. What can I do in the bedroom? Make sure that you have a light by your bed that is easy to reach. Do not use any sheets or blankets for your bed that hang to  the floor. Have a firm chair with side arms that you can use for support when you get dressed. What can I do in the kitchen? Clean up any spills right away. If you need to reach something above you, use a step stool with a grab bar. Keep electrical cords out of the way. Do not use floor polish or wax that makes floors slippery. What can I do with my stairs? Do not leave any items on the stairs. Make sure that you have a light switch at the top and the bottom of the stairs. Make sure that there are handrails on both sides of the stairs. Fix handrails that are broken or loose. Install nonslip stair treads on all your stairs. Avoid having throw rugs at the top or bottom of the stairs. Choose a carpet that does not hide the edge of the steps on the stairs. Check carpeting to make sure that it is firmly attached to the stairs. Fix carpet that is loose or worn. What can I do on the outside of my home? Use bright outdoor lighting. Fix the edges of walkways and driveways and fix any cracks. Remove anything that might make you trip as you walk through a door, such as a raised step or threshold. Trim any bushes or trees on paths to your home. Check to see if handrails  are loose or broken and that both sides of all steps have handrails. Install guardrails along the edges of any raised decks and porches. Clear paths of anything that can make you trip, such as tools or rocks. Have leaves, snow, or ice cleared regularly. Use sand or salt on paths during winter. Clean up any spills in your garage right away. This includes grease or oil spills. What other actions can I take? Wear shoes that: Have a low heel. Do not wear high heels. Have rubber bottoms. Feel good on your feet and fit well. Are closed at the toe. Do not wear open-toe sandals. Use tools that help you move around if needed. These include: Canes. Walkers. Scooters. Crutches. Review your medicines with your doctor. Some medicines can  make you feel dizzy. This can increase your chance of falling. Ask your doctor what else you can do to help prevent falls. Where to find more information Centers for Disease Control and Prevention, [employer]: http://www.wolf.info/ National Institute on Aging: http://kim-miller.com/ Contact a doctor if: You are afraid of falling at home. You feel weak, drowsy, or dizzy at home. You fall at home. Summary There are many simple things that you can do to make your home safe and to help prevent falls. Ways to make your home safe include removing things that can make you trip and installing grab bars in the bathroom. Ask for help when making these changes in your home. This information is not intended to replace advice given to you by your health care provider. Make sure you discuss any questions you have with your health care provider. Document Revised: 08/31/2021 Document Reviewed: 07/02/2020 Elsevier Patient Education  [place] Maintenance, Female Adopting a healthy lifestyle and getting preventive care are important in promoting health and wellness. Ask your health care provider about: The right schedule for you to have regular tests and exams. Things you can do on your own to prevent diseases and keep yourself healthy. What should I know about diet, weight, and exercise? Eat a healthy diet  Eat a diet that includes plenty of vegetables, fruits, low-fat dairy products, and lean protein. Do not eat a lot of foods that are high in solid fats, added sugars, or sodium. Maintain a healthy weight Body mass index (BMI) is used to identify weight problems. It estimates body fat based on height and weight. Your health care provider can help determine your BMI and help you achieve or maintain a healthy weight. Get regular exercise Get regular exercise. This is one of the most important things you can do for your health. Most adults should: Exercise for at least 150 minutes each week. The exercise  should increase your heart rate and make you sweat (moderate-intensity exercise). Do strengthening exercises at least twice a week. This is in addition to the moderate-intensity exercise. Spend less time sitting. Even light physical activity can be beneficial. Watch cholesterol and blood lipids Have your blood tested for lipids and cholesterol at 83 years of age, then have this test every 5 years. Have your cholesterol levels checked more often if: Your lipid or cholesterol levels are high. You are older than 83 years of age. You are at high risk for heart disease. What should I know about cancer screening? Depending on your health history and family history, you may need to have cancer screening at various ages. This may include screening for: Breast cancer. Cervical cancer. Colorectal cancer. Skin cancer. Lung cancer. What should I know about heart  disease, diabetes, and high blood pressure? Blood pressure and heart disease High blood pressure causes heart disease and increases the risk of stroke. This is more likely to develop in people who have high blood pressure readings or are overweight. Have your blood pressure checked: Every 3-5 years if you are 17-57 years of age. Every year if you are 61 years old or older. Diabetes Have regular diabetes screenings. This checks your fasting blood sugar level. Have the screening done: Once every three years after age 53 if you are at a normal weight and have a low risk for diabetes. More often and at a younger age if you are overweight or have a high risk for diabetes. What should I know about preventing infection? Hepatitis B If you have a higher risk for hepatitis B, you should be screened for this virus. Talk with your health care provider to find out if you are at risk for hepatitis B infection. Hepatitis C Testing is recommended for: Everyone born from [birth year] through 1967. Anyone with known risk factors for hepatitis C. Sexually  transmitted infections (STIs) Get screened for STIs, including gonorrhea and chlamydia, if: You are sexually active and are younger than 83 years of age. You are older than 83 years of age and your health care provider tells you that you are at risk for this type of infection. Your sexual activity has changed since you were last screened, and you are at increased risk for chlamydia or gonorrhea. Ask your health care provider if you are at risk. Ask your health care provider about whether you are at high risk for HIV. Your health care provider may recommend a prescription medicine to help prevent HIV infection. If you choose to take medicine to prevent HIV, you should first get tested for HIV. You should then be tested every 3 months for as long as you are taking the medicine. Pregnancy If you are about to stop having your period (premenopausal) and you may become pregnant, seek counseling before you get pregnant. Take 400 to 800 micrograms (mcg) of folic acid every day if you become pregnant. Ask for birth control (contraception) if you want to prevent pregnancy. Osteoporosis and menopause Osteoporosis is a disease in which the bones lose minerals and strength with aging. This can result in bone fractures. If you are 30 years old or older, or if you are at risk for osteoporosis and fractures, ask your health care provider if you should: Be screened for bone loss. Take a calcium or vitamin D supplement to lower your risk of fractures. Be given hormone replacement therapy (HRT) to treat symptoms of menopause. Follow these instructions at home: Alcohol use Do not drink alcohol if: Your health care provider tells you not to drink. You are pregnant, may be pregnant, or are planning to become pregnant. If you drink alcohol: Limit how much you have to: 0-1 drink a day. Know how much alcohol is in your drink. In the [place] one drink equals one 12 oz bottle of beer (355 mL), one 5 oz glass of wine (148  mL), or one 1 oz glass of hard liquor (44 mL). Lifestyle Do not use any products that contain nicotine or tobacco. These products include cigarettes, chewing tobacco, and vaping devices, such as e-cigarettes. If you need help quitting, ask your health care provider. Do not use street drugs. Do not share needles. Ask your health care provider for help if you need support or information about quitting drugs. General instructions Schedule  regular health, dental, and eye exams. Stay current with your vaccines. Tell your health care provider if: You often feel depressed. You have ever been abused or do not feel safe at home. Summary Adopting a healthy lifestyle and getting preventive care are important in promoting health and wellness. Follow your health care provider's instructions about healthy diet, exercising, and getting tested or screened for diseases. Follow your health care provider's instructions on monitoring your cholesterol and blood pressure. This information is not intended to replace advice given to you by your health care provider. Make sure you discuss any questions you have with your health care provider. Document Revised: 04/20/2021 Document Reviewed: 04/20/2021 Elsevier Patient Education  [place].

## 2022-05-04 NOTE — Progress Notes (Signed)
Annual Wellness Visit  Subjective:   Alexandria Jones is a 81 y.o. Female who presents for Medicare Annual initial  preventive examination.  I connected with  Alexandria Jones on behalf of MIKAIA JANVIER on 05/04/22 by a audio enabled telemedicine application and verified that I am speaking with Alexandria Jones about the correct person using two identifiers.  Patient Location: Home  Provider Location: Home Office  I discussed the limitations of evaluation and management by telemedicine. The patient expressed understanding and agreed to proceed.   [name] is a 81 y.o. female who presents today for her Annual Wellness Visit. Alexandria Jones does not have additional problems or concerns about Alexandria Jones to discuss today. His only concern is that Alexandria Jones is not interested in physical activity and will not exercise   Alexandria Jones does not voice caregiver burden - feels between him and his 3 daughter and and sons in law that they are managing Alexandria Jones's care well and do not need outside support    Objective:    There were no vitals filed for this visit.   BP Readings from Last 3 Encounters:  11/09/21 110/60  11/04/21 106/78  10/13/21 94/70   Wt Readings from Last 3 Encounters:  11/09/21 110 lb (49.9 kg)  11/04/21 111 lb (50.3 kg)  10/13/21 109 lb 3.2 oz (49.5 kg)      There is no height or weight on file to calculate BMI.     11/04/2021    9:51 AM  Advanced Directives  Does Patient Have a Medical Advance Directive? Yes  Type of Paramedic of [place];Living will  Copy of [place] in Chart? No - copy requested    Current Medications (verified) Outpatient Encounter Medications as of 05/04/2022  Medication Sig   terbinafine (LAMISIL) 250 MG tablet Take 250 mg by mouth daily.   acetaminophen (TYLENOL) 500 MG tablet Take 1,000 mg by mouth every 6 (six) hours as needed.   memantine (NAMENDA) 10 MG tablet TAKE 1 TABLET(10 MG) BY MOUTH  TWICE DAILY   venlafaxine XR (EFFEXOR-XR) 37.5 MG 24 hr capsule TAKE 1 CAPSULE(37.5 MG) BY MOUTH DAILY   No facility-administered encounter medications on file as of 05/04/2022.    Allergies (verified) Macrobid [nitrofurantoin], Codeine, Morphine, and Isosorbide nitrate   History: Past Medical History:  Diagnosis Date   Breast cancer ([place]) 1990   cured S/P radiation treatment & chemo   GERD (gastroesophageal reflux disease)    Memory difficulty 03/06/2018   Migraines    Osteoarthritis    Past Surgical History:  Procedure Laterality Date   APPENDECTOMY     BREAST LUMPECTOMY     left lumpectomy  1990   TUBAL LIGATION     WRIST SURGERY     Family History  Problem Relation Age of Onset   Dementia Mother    Cancer Mother    Dementia Father    Depression Father    Cancer Sister    Cancer Brother    Social History   Socioeconomic History   Marital status: Married    Spouse name: Not on file   Number of children: Not on file   Years of education: Masters- education   Highest education level: Not on file  Occupational History   Not on file  Tobacco Use   Smoking status: Never   Smokeless tobacco: Never  Vaping Use   Vaping Use: Never used  Substance and Sexual Activity   Alcohol use: Yes  Comment: Rare- wine   Drug use: Never   Sexual activity: Not on file  Other Topics Concern   Not on file  Social History Narrative   Lives with husband   Caffeine use: sometimes tea   Right handed    Social Determinants of Health   Financial Resource Strain: Not on file  Food Insecurity: Not on file  Transportation Needs: Not on file  Physical Activity: Inactive   Days of Exercise per Week: 0 days   Minutes of Exercise per Session: 0 min  Stress: Not on file  Social Connections: Not on file    Tobacco Counseling Counseling given: Not Answered       Diabetic?no         Activities of Daily Living    05/04/2022    9:45 AM 07/28/2021   10:18 AM  In your  present state of health, do you have any difficulty performing the following activities:  Hearing? 0 0  Vision? 0 0  Difficulty concentrating or making decisions? 1 1  Comment dementia On namenda  Walking or climbing stairs? 1 0  Dressing or bathing? 0 0  Doing errands, shopping? 1 1    Patient Care Team: Cox, Elnita Maxwell, MD as PCP - General (Family Medicine)  Indicate any recent Medical Services you may have received from other than [facility] providers in the past year (date may be approximate).     Assessment:   This is a routine wellness examination for Alexandria Jones.  Hearing/Vision screen No results found.  Dietary issues and exercise activities discussed:     Goals Addressed   None    Depression Screen    11/04/2021    9:11 AM 07/28/2021   10:20 AM 07/28/2021   10:19 AM 07/20/2020    8:40 PM  PHQ 2/9 Scores  PHQ - 2 Score 0  0 0  Exception Documentation  Medical reason      Fall Risk    05/04/2022   10:02 AM 11/04/2021    9:12 AM 09/21/2021   10:30 AM 07/28/2021   10:20 AM 06/16/2021    3:47 PM  Fall Risk   Falls in the past year? 1 0 0 1 0  Number falls in past yr: 1 0 0 0 0  Injury with Fall? 0 0 0 0 0  Risk for fall due to : Mental status change;History of fall(s)  No Fall Risks No Fall Risks No Fall Risks  Follow up Education provided Falls evaluation completed Falls evaluation completed Falls evaluation completed Falls evaluation completed    [place]:  Any stairs in or around the home? Yes  - fell around stairs - not witnessed If so, are there any without handrails? Yes  Home free of loose throw rugs in walkways, pet beds, electrical cords, etc? No  Adequate lighting in your home to reduce risk of falls? Yes   ASSISTIVE DEVICES UTILIZED TO PREVENT FALLS:  Life alert? No  Use of a cane, walker or w/c? No  Grab bars in the bathroom? No  Shower chair or bench in shower? No  Elevated toilet seat or a handicapped toilet? Yes      Cognitive Function:    07/28/2021   10:23 AM 01/26/2021   11:22 AM 09/12/2018   11:41 AM 03/06/2018    9:18 AM  MMSE - Mini Mental State Exam  Orientation to time 0 '1 2 1  '$ Orientation to time comments 19 something, fall, ?, Monday, ?  She was able to provide the day of the week    Orientation to Place '4 5 3 4  '$ Orientation to Place-comments [place], ? [place], [facility], 1st floor     Registration '3 3 3 3  '$ Registration-comments Pen, watch, ring     Attention/ Calculation '2 2 2 5  '$ Attention/Calculation-comments DLO??     Recall 0 0 2 2  Recall-comments Door, Floor, ?     Language- name 2 objects '2 2 1 2  '$ Language- name 2 objects-comments Watch, Pen     Language- repeat 0 0 1 1  Language- repeat-comments No its, and or above     Language- follow 3 step command '1 3 3 2  '$ Language- follow 3 step command-comments Take this piece of paper with your right hand, fold in half and place on table.     Language- read & follow direction '1 1 1 1  '$ Language-read & follow direction-comments "Close your eyes"     Write a sentence 0 '1 1 1  '$ Write a sentence-comments Attempted to write "I am at the doctors today" however what she wrote was "I nemt the doc.s tody"     Copy design 0 1 0 1  Total score '13 19 19 23        '$ Immunizations Immunization History  Administered Date(s) Administered   Fluad Quad(high Dose 65+) 08/25/2020, 09/07/2021   Influenza-Unspecified 08/13/2018   PFIZER(Purple Top)SARS-COV-2 Vaccination 12/26/2019, 01/16/2020, 09/12/2020, 05/04/2021   Pfizer Covid-19 Vaccine Bivalent Booster 61yr & up 10/06/2021   Pneumococcal Conjugate-13 08/13/2014   Pneumococcal Polysaccharide-23 04/27/2016   Tdap 07/13/2017    TDAP status: Up to date  Flu Vaccine status: Up to date  Pneumococcal vaccine status: Up to date  Covid-19 vaccine status: Completed vaccines  Qualifies for Shingles Vaccine? Yes   [place]re [place] pt has had vaccine in the past  Screening Tests Health Maintenance  Topic  Date Due   Zoster Vaccines- Shingrix (1 of 2) Never done   MAMMOGRAM  04/11/2021   DEXA SCAN  04/11/2022   INFLUENZA VACCINE  07/13/2022   TETANUS/TDAP  07/14/2027   Pneumonia Vaccine 81 Years old  Completed   COVID-19 Vaccine  Completed   HPV VACCINES  Aged Out    Health Maintenance  Health Maintenance Due  Topic Date Due   Zoster Vaccines- Shingrix (1 of 2) Never done   MAMMOGRAM  04/11/2021   DEXA SCAN  04/11/2022    Colorectal cancer screening: No longer required.   Mammogram status: No longer required due to age - last mammogram 2 years ago - RDeidre Aladoes not wish to pursue further mammograms since breast cancer was in 1990's.  Bone Density status: Completed 2021. Results reflect: Bone density results: OSTEOPOROSIS. Repeat every 5 years. - Pt not taking alendonate or vit d or calcium  Lung Cancer Screening: (Low Dose CT Chest recommended if Age 83-80years, 30 pack-year currently smoking OR have quit w/in 15years.) does not qualify.    Additional Screening:  Hepatitis C Screening: does not qualify;   Vision Screening: Recommended annual ophthalmology exams for early detection of glaucoma and other disorders of the eye. Is the patient up to date with their annual eye exam?  Yes  Who is the provider or what is the name of the office in which the patient attends annual eye exams[place] Recommended annual dental exams for proper oral hygiene - goes twice a year  Community Resource Referral / Chronic  Care Management: CRR required this visit?  No   CCM required this visit?  No      Plan:     Assessment & Plan   Annual wellness visit done today including the all of the following: Reviewed patient's Family Medical History Reviewed and updated list of patient's medical providers Assessment of cognitive impairment was done Assessed patient's functional ability Established a written schedule for health screening [place] Completed and Reviewed   I have personally reviewed and noted the following in the patient's chart:   Medical and social history Use of alcohol, tobacco or illicit drugs  Current medications and supplements including opioid prescriptions.  Functional ability and status Nutritional status Physical activity Advanced directives List of other physicians Hospitalizations, surgeries, and ER visits in previous 12 months Vitals Screenings to include cognitive, depression, and falls Referrals and appointments  In addition, I have reviewed and discussed with patient certain preventive protocols, quality metrics, and best practice recommendations. A written personalized care plan for preventive services as well as general preventive health recommendations were provided to patient.     Carvel Getting, NP   05/04/2022   Nurse Notes:  - Pt with several falls in last year, 3 recently. Not witnessed - Alexandria Jones present but not in room when falls happened. No injuries. Discussed life alert - husband feels isn't needed at this time because someone is always with pt - Would benefit from grab bars in bathroom - Has advanced directives - advised to take a  copy to Dr. Tobie Poet   I spent 20 minutes with patient for this annual wellness visit

## 2022-05-05 ENCOUNTER — Ambulatory Visit: Payer: Medicare PPO | Admitting: Family Medicine

## 2022-05-12 ENCOUNTER — Ambulatory Visit: Payer: Medicare PPO | Admitting: Family Medicine

## 2022-05-12 VITALS — BP 110/60 | HR 60 | Temp 98.7°F | Resp 14 | Ht 62.0 in | Wt 110.0 lb

## 2022-05-12 DIAGNOSIS — F411 Generalized anxiety disorder: Secondary | ICD-10-CM | POA: Diagnosis not present

## 2022-05-12 DIAGNOSIS — F0280 Dementia in other diseases classified elsewhere without behavioral disturbance: Secondary | ICD-10-CM | POA: Diagnosis not present

## 2022-05-12 DIAGNOSIS — E538 Deficiency of other specified B group vitamins: Secondary | ICD-10-CM | POA: Diagnosis not present

## 2022-05-12 DIAGNOSIS — R5383 Other fatigue: Secondary | ICD-10-CM

## 2022-05-12 DIAGNOSIS — G301 Alzheimer's disease with late onset: Secondary | ICD-10-CM | POA: Diagnosis not present

## 2022-05-12 DIAGNOSIS — K219 Gastro-esophageal reflux disease without esophagitis: Secondary | ICD-10-CM

## 2022-05-12 NOTE — Progress Notes (Unsigned)
Subjective:  Patient ID: Alexandria Jones, female    DOB: 1940/12/22  Age: 81 y.o. MRN: [id number]  Chief Complaint  Patient presents with   Dementia        Gastroesophageal Reflux   Osteoporosis   HPI Alzheimer's dementia: Patient forgets children's names. She requires assistance to perform ADLs.  Her husband is caretaker.  She will not bathe unless prompted by her husband.  It can take 1- 3 hours for her to get dressed.  Patient is taking namenda, but not helping. Aricept did not seem to help.  Patient has a good appetite although he cannot seem to get her to eat more fruits or vegetables.  Her husband has essentially given up and allows her to eat what she wants.  She has been burping a lot more.  He has been giving her Tums.  He question whether maybe she could have colon cancer and this because this.  I explained this is not a typical symptom of colon cancer and I would not aggressively look or screen for cancer as her dementia is at moderate stage.  I do not feel she would Tolerate any cancer treatment  He has brought her healthcare power of attorney and living well today.  Vitamin B12 deficiency: Patient  is taking B12  shots monthly. Last one was last month.  Here for recheck for B12.  GAD: She is taking Effexor XR. Anxiety is well controlled.  Patient has had a period where she was off of the effexor for 4 days and per her husband she did get depressed.  HPI: Reviewed and updated.  Current Outpatient Medications on File Prior to Visit  Medication Sig Dispense Refill   acetaminophen (TYLENOL) 500 MG tablet Take 1,000 mg by mouth every 6 (six) hours as needed.     terbinafine (LAMISIL) 250 MG tablet Take 250 mg by mouth daily.     venlafaxine XR (EFFEXOR-XR) 37.5 MG 24 hr capsule TAKE 1 CAPSULE(37.5 MG) BY MOUTH DAILY 90 capsule 0   No current facility-administered medications on file prior to visit.   Past Medical History:  Diagnosis Date   Breast cancer ([place]) 1990    cured S/P radiation treatment & chemo   GERD (gastroesophageal reflux disease)    Memory difficulty 03/06/2018   Migraines    Osteoarthritis    Past Surgical History:  Procedure Laterality Date   APPENDECTOMY     BREAST LUMPECTOMY     left lumpectomy  1990   TUBAL LIGATION     WRIST SURGERY      Family History  Problem Relation Age of Onset   Dementia Mother    Cancer Mother    Dementia Father    Depression Father    Cancer Sister    Cancer Brother    Social History   Socioeconomic History   Marital status: Married    Spouse name: Not on file   Number of children: Not on file   Years of education: Masters- education   Highest education level: Not on file  Occupational History   Not on file  Tobacco Use   Smoking status: Never   Smokeless tobacco: Never  Vaping Use   Vaping Use: Never used  Substance and Sexual Activity   Alcohol use: Yes    Comment: Rare- wine   Drug use: Never   Sexual activity: Not on file  Other Topics Concern   Not on file  Social History Narrative   Lives with husband  Caffeine use: sometimes tea   Right handed    Social Determinants of Health   Financial Resource Strain: Not on file  Food Insecurity: Not on file  Transportation Needs: Not on file  Physical Activity: Inactive (05/04/2022)   Exercise Vital Sign    Days of Exercise per Week: 0 days    Minutes of Exercise per Session: 0 min  Stress: Not on file  Social Connections: Not on file    Review of Systems  Constitutional:  Positive for fatigue. Negative for chills and fever.  HENT:  Negative for congestion, rhinorrhea and sore throat.   Respiratory:  Negative for cough and shortness of breath.   Cardiovascular:  Negative for chest pain.  Gastrointestinal:  Negative for abdominal pain, constipation, diarrhea, nausea and vomiting.  Genitourinary:  Negative for dysuria and urgency.  Musculoskeletal:  Negative for back pain and myalgias.  Neurological:  Negative for  dizziness, weakness, light-headedness and headaches.  Psychiatric/Behavioral:  Negative for dysphoric mood. The patient is not nervous/anxious.      Objective:  There were no vitals taken for this visit.     11/09/2021   11:35 AM 11/04/2021    9:12 AM 10/13/2021    9:51 AM  BP/Weight  Systolic BP 696 789 94  Diastolic BP 60 78 70  Wt. (Lbs) 110 111 109.2  BMI 20.12 kg/m2 20.3 kg/m2 19.97 kg/m2    Physical Exam Vitals reviewed.  Constitutional:      Appearance: Normal appearance. She is normal weight.  Neck:     Vascular: No carotid bruit.  Cardiovascular:     Rate and Rhythm: Normal rate and regular rhythm.     Heart sounds: Normal heart sounds.  Pulmonary:     Effort: Pulmonary effort is normal. No respiratory distress.     Breath sounds: Normal breath sounds.  Abdominal:     General: Abdomen is flat. Bowel sounds are normal.     Palpations: Abdomen is soft.     Tenderness: There is no abdominal tenderness.  Neurological:     Mental Status: She is alert.  Psychiatric:        Mood and Affect: Mood normal.        Behavior: Behavior normal.     Diabetic Foot Exam - Simple   No data filed      Lab Results  Component Value Date   WBC 5.4 05/12/2022   HGB 15.8 05/12/2022   HCT 47.0 (H) 05/12/2022   PLT 302 05/12/2022   GLUCOSE 83 05/12/2022   ALT 13 05/12/2022   AST 21 05/12/2022   NA 138 05/12/2022   K 4.5 05/12/2022   CL 101 05/12/2022   CREATININE 0.90 05/12/2022   BUN 10 05/12/2022   CO2 23 05/12/2022   TSH 1.650 05/12/2022      Assessment & Plan:   Problem List Items Addressed This Visit       Nervous and Auditory   Late onset Alzheimer's disease without behavioral disturbance ([place])    Discontinue Namenda.  Patient does not feel this is helping.        Other   B12 deficiency - Primary    Check B12 level      Relevant Orders   Vitamin B12 (Completed)   GAD (generalized anxiety disorder)    Continue current medications.  Continue  Effexor XR      Other fatigue    Neck labs      Relevant Orders   CBC with Differential/Platelet (Completed)  Comprehensive metabolic panel (Completed)   TSH (Completed)  .  Orders Placed This Encounter  Procedures   Vitamin B12   CBC with Differential/Platelet   Comprehensive metabolic panel   TSH    Total time spent on today's visit was greater than 30 minutes, including both face-to-face time and nonface-to-face time personally spent on review of chart (labs and imaging), discussing labs and goals, discussing further work-up, treatment options, referrals to specialist if needed, reviewing outside records of pertinent, answering patient's questions, and coordinating care.  Follow-up: Return in about 6 months (around 11/11/2022).  I,Marla I Leal-Borjas,acting as a scribe for Rochel Brome, MD.,have documented all relevant documentation on the behalf of Rochel Brome, MD,as directed by  Rochel Brome, MD while in the presence of Rochel Brome, MD.   An After Visit Summary was printed and given to the patient.  Rochel Brome, MD [facility] [phone]

## 2022-05-12 NOTE — Patient Instructions (Signed)
Discontinue memantadine.  [facility] [phone] [address] [place], [place] [address] [phone]  Meets the second Wednesday of each month at 1 p.m.

## 2022-05-13 LAB — CBC WITH DIFFERENTIAL/PLATELET
Basophils Absolute: 0.1 10*3/uL (ref 0.0–0.2)
Basos: 1 %
EOS (ABSOLUTE): 0.1 10*3/uL (ref 0.0–0.4)
Eos: 1 %
Hematocrit: 47 % — ABNORMAL HIGH (ref 34.0–46.6)
Hemoglobin: 15.8 g/dL (ref 11.1–15.9)
Immature Grans (Abs): 0 10*3/uL (ref 0.0–0.1)
Immature Granulocytes: 0 %
Lymphocytes Absolute: 1.1 10*3/uL (ref 0.7–3.1)
Lymphs: 20 %
MCH: 29.7 pg (ref 26.6–33.0)
MCHC: 33.6 g/dL (ref 31.5–35.7)
MCV: 88 fL (ref 79–97)
Monocytes Absolute: 0.6 10*3/uL (ref 0.1–0.9)
Monocytes: 11 %
Neutrophils Absolute: 3.6 10*3/uL (ref 1.4–7.0)
Neutrophils: 67 %
Platelets: 302 10*3/uL (ref 150–450)
RBC: 5.32 x10E6/uL — ABNORMAL HIGH (ref 3.77–5.28)
RDW: 13.1 % (ref 11.7–15.4)
WBC: 5.4 10*3/uL (ref 3.4–10.8)

## 2022-05-13 LAB — COMPREHENSIVE METABOLIC PANEL
ALT: 13 IU/L (ref 0–32)
AST: 21 IU/L (ref 0–40)
Albumin/Globulin Ratio: 2.3 — ABNORMAL HIGH (ref 1.2–2.2)
Albumin: 4.5 g/dL (ref 3.6–4.6)
Alkaline Phosphatase: 74 IU/L (ref 44–121)
BUN/Creatinine Ratio: 11 — ABNORMAL LOW (ref 12–28)
BUN: 10 mg/dL (ref 8–27)
Bilirubin Total: 0.3 mg/dL (ref 0.0–1.2)
CO2: 23 mmol/L (ref 20–29)
Calcium: 9.8 mg/dL (ref 8.7–10.3)
Chloride: 101 mmol/L (ref 96–106)
Creatinine, Ser: 0.9 mg/dL (ref 0.57–1.00)
Globulin, Total: 2 g/dL (ref 1.5–4.5)
Glucose: 83 mg/dL (ref 70–99)
Potassium: 4.5 mmol/L (ref 3.5–5.2)
Sodium: 138 mmol/L (ref 134–144)
Total Protein: 6.5 g/dL (ref 6.0–8.5)
eGFR: 64 mL/min/{1.73_m2} (ref >59)

## 2022-05-13 LAB — VITAMIN B12: Vitamin B-12: 493 pg/mL (ref 232–1245)

## 2022-05-13 LAB — TSH: TSH: 1.65 u[IU]/mL (ref 0.450–4.500)

## 2022-05-16 ENCOUNTER — Encounter: Payer: Self-pay | Admitting: Family Medicine

## 2022-05-16 NOTE — Assessment & Plan Note (Addendum)
Check labs 

## 2022-05-16 NOTE — Assessment & Plan Note (Addendum)
Discontinue Namenda.  Patient's husband can not tell it is helping.

## 2022-05-16 NOTE — Assessment & Plan Note (Signed)
Continue current medications.  Continue Effexor XR

## 2022-05-16 NOTE — Assessment & Plan Note (Signed)
Check B12 level. 

## 2022-05-21 DIAGNOSIS — B351 Tinea unguium: Secondary | ICD-10-CM | POA: Diagnosis not present

## 2022-05-24 DIAGNOSIS — Z79899 Other long term (current) drug therapy: Secondary | ICD-10-CM | POA: Diagnosis not present

## 2022-05-24 DIAGNOSIS — B351 Tinea unguium: Secondary | ICD-10-CM | POA: Diagnosis not present

## 2022-05-31 ENCOUNTER — Other Ambulatory Visit: Payer: Self-pay | Admitting: Physician Assistant

## 2022-08-29 ENCOUNTER — Other Ambulatory Visit: Payer: Self-pay | Admitting: Physician Assistant

## 2022-08-30 DIAGNOSIS — D485 Neoplasm of uncertain behavior of skin: Secondary | ICD-10-CM | POA: Diagnosis not present

## 2022-08-30 DIAGNOSIS — B351 Tinea unguium: Secondary | ICD-10-CM | POA: Diagnosis not present

## 2022-08-30 DIAGNOSIS — L820 Inflamed seborrheic keratosis: Secondary | ICD-10-CM | POA: Diagnosis not present

## 2022-09-01 DIAGNOSIS — R55 Syncope and collapse: Secondary | ICD-10-CM | POA: Diagnosis not present

## 2022-09-01 DIAGNOSIS — S0990XA Unspecified injury of head, initial encounter: Secondary | ICD-10-CM | POA: Diagnosis not present

## 2022-09-01 DIAGNOSIS — F05 Delirium due to known physiological condition: Secondary | ICD-10-CM | POA: Diagnosis not present

## 2022-09-01 DIAGNOSIS — F0390 Unspecified dementia without behavioral disturbance: Secondary | ICD-10-CM | POA: Diagnosis not present

## 2022-09-01 DIAGNOSIS — R338 Other retention of urine: Secondary | ICD-10-CM | POA: Diagnosis not present

## 2022-09-01 DIAGNOSIS — G3189 Other specified degenerative diseases of nervous system: Secondary | ICD-10-CM | POA: Diagnosis not present

## 2022-09-01 DIAGNOSIS — S72002A Fracture of unspecified part of neck of left femur, initial encounter for closed fracture: Secondary | ICD-10-CM | POA: Diagnosis not present

## 2022-09-01 DIAGNOSIS — J189 Pneumonia, unspecified organism: Secondary | ICD-10-CM | POA: Diagnosis not present

## 2022-09-01 DIAGNOSIS — R9431 Abnormal electrocardiogram [ECG] [EKG]: Secondary | ICD-10-CM | POA: Diagnosis not present

## 2022-09-01 DIAGNOSIS — E876 Hypokalemia: Secondary | ICD-10-CM | POA: Diagnosis not present

## 2022-09-01 DIAGNOSIS — S72142A Displaced intertrochanteric fracture of left femur, initial encounter for closed fracture: Secondary | ICD-10-CM | POA: Diagnosis not present

## 2022-09-01 DIAGNOSIS — T148XXA Other injury of unspecified body region, initial encounter: Secondary | ICD-10-CM | POA: Diagnosis not present

## 2022-09-01 DIAGNOSIS — R0902 Hypoxemia: Secondary | ICD-10-CM | POA: Diagnosis not present

## 2022-09-01 DIAGNOSIS — I959 Hypotension, unspecified: Secondary | ICD-10-CM | POA: Diagnosis not present

## 2022-09-01 DIAGNOSIS — Z043 Encounter for examination and observation following other accident: Secondary | ICD-10-CM | POA: Diagnosis not present

## 2022-09-01 DIAGNOSIS — M25512 Pain in left shoulder: Secondary | ICD-10-CM | POA: Diagnosis not present

## 2022-09-01 DIAGNOSIS — D62 Acute posthemorrhagic anemia: Secondary | ICD-10-CM | POA: Diagnosis not present

## 2022-09-01 DIAGNOSIS — M549 Dorsalgia, unspecified: Secondary | ICD-10-CM | POA: Diagnosis not present

## 2022-09-01 DIAGNOSIS — W19XXXA Unspecified fall, initial encounter: Secondary | ICD-10-CM | POA: Diagnosis not present

## 2022-09-01 DIAGNOSIS — F03C11 Unspecified dementia, severe, with agitation: Secondary | ICD-10-CM | POA: Diagnosis not present

## 2022-09-01 DIAGNOSIS — Z4789 Encounter for other orthopedic aftercare: Secondary | ICD-10-CM | POA: Diagnosis not present

## 2022-09-01 DIAGNOSIS — Z7401 Bed confinement status: Secondary | ICD-10-CM | POA: Diagnosis not present

## 2022-09-01 DIAGNOSIS — S72002D Fracture of unspecified part of neck of left femur, subsequent encounter for closed fracture with routine healing: Secondary | ICD-10-CM | POA: Diagnosis not present

## 2022-09-01 DIAGNOSIS — I10 Essential (primary) hypertension: Secondary | ICD-10-CM | POA: Diagnosis not present

## 2022-09-01 DIAGNOSIS — M47812 Spondylosis without myelopathy or radiculopathy, cervical region: Secondary | ICD-10-CM | POA: Diagnosis not present

## 2022-09-01 DIAGNOSIS — S72009A Fracture of unspecified part of neck of unspecified femur, initial encounter for closed fracture: Secondary | ICD-10-CM | POA: Diagnosis not present

## 2022-09-07 DIAGNOSIS — F0390 Unspecified dementia without behavioral disturbance: Secondary | ICD-10-CM | POA: Diagnosis not present

## 2022-09-07 DIAGNOSIS — S72009A Fracture of unspecified part of neck of unspecified femur, initial encounter for closed fracture: Secondary | ICD-10-CM | POA: Diagnosis not present

## 2022-09-07 DIAGNOSIS — R262 Difficulty in walking, not elsewhere classified: Secondary | ICD-10-CM | POA: Diagnosis not present

## 2022-09-07 DIAGNOSIS — L03116 Cellulitis of left lower limb: Secondary | ICD-10-CM | POA: Diagnosis not present

## 2022-09-07 DIAGNOSIS — L7682 Other postprocedural complications of skin and subcutaneous tissue: Secondary | ICD-10-CM | POA: Diagnosis not present

## 2022-09-07 DIAGNOSIS — U071 COVID-19: Secondary | ICD-10-CM | POA: Diagnosis not present

## 2022-09-07 DIAGNOSIS — J189 Pneumonia, unspecified organism: Secondary | ICD-10-CM | POA: Diagnosis not present

## 2022-09-07 DIAGNOSIS — L97819 Non-pressure chronic ulcer of other part of right lower leg with unspecified severity: Secondary | ICD-10-CM | POA: Diagnosis not present

## 2022-09-07 DIAGNOSIS — Z4789 Encounter for other orthopedic aftercare: Secondary | ICD-10-CM | POA: Diagnosis not present

## 2022-09-07 DIAGNOSIS — Z7401 Bed confinement status: Secondary | ICD-10-CM | POA: Diagnosis not present

## 2022-09-07 DIAGNOSIS — S72002A Fracture of unspecified part of neck of left femur, initial encounter for closed fracture: Secondary | ICD-10-CM | POA: Diagnosis not present

## 2022-09-07 DIAGNOSIS — S72002D Fracture of unspecified part of neck of left femur, subsequent encounter for closed fracture with routine healing: Secondary | ICD-10-CM | POA: Diagnosis not present

## 2022-09-07 DIAGNOSIS — S72142A Displaced intertrochanteric fracture of left femur, initial encounter for closed fracture: Secondary | ICD-10-CM | POA: Diagnosis not present

## 2022-09-07 DIAGNOSIS — M25552 Pain in left hip: Secondary | ICD-10-CM | POA: Diagnosis not present

## 2022-09-07 DIAGNOSIS — D62 Acute posthemorrhagic anemia: Secondary | ICD-10-CM | POA: Diagnosis not present

## 2022-09-07 DIAGNOSIS — F0393 Unspecified dementia, unspecified severity, with mood disturbance: Secondary | ICD-10-CM | POA: Diagnosis not present

## 2022-09-08 DIAGNOSIS — R262 Difficulty in walking, not elsewhere classified: Secondary | ICD-10-CM | POA: Diagnosis not present

## 2022-09-08 DIAGNOSIS — J189 Pneumonia, unspecified organism: Secondary | ICD-10-CM | POA: Diagnosis not present

## 2022-09-08 DIAGNOSIS — F0393 Unspecified dementia, unspecified severity, with mood disturbance: Secondary | ICD-10-CM | POA: Diagnosis not present

## 2022-09-08 DIAGNOSIS — S72002A Fracture of unspecified part of neck of left femur, initial encounter for closed fracture: Secondary | ICD-10-CM | POA: Diagnosis not present

## 2022-09-09 DIAGNOSIS — L97819 Non-pressure chronic ulcer of other part of right lower leg with unspecified severity: Secondary | ICD-10-CM | POA: Diagnosis not present

## 2022-09-14 DIAGNOSIS — F0393 Unspecified dementia, unspecified severity, with mood disturbance: Secondary | ICD-10-CM | POA: Diagnosis not present

## 2022-09-14 DIAGNOSIS — S72002A Fracture of unspecified part of neck of left femur, initial encounter for closed fracture: Secondary | ICD-10-CM | POA: Diagnosis not present

## 2022-09-14 DIAGNOSIS — U071 COVID-19: Secondary | ICD-10-CM | POA: Diagnosis not present

## 2022-09-14 DIAGNOSIS — R262 Difficulty in walking, not elsewhere classified: Secondary | ICD-10-CM | POA: Diagnosis not present

## 2022-09-20 DIAGNOSIS — L7682 Other postprocedural complications of skin and subcutaneous tissue: Secondary | ICD-10-CM | POA: Diagnosis not present

## 2022-09-20 DIAGNOSIS — M25552 Pain in left hip: Secondary | ICD-10-CM | POA: Diagnosis not present

## 2022-09-21 DIAGNOSIS — S72142A Displaced intertrochanteric fracture of left femur, initial encounter for closed fracture: Secondary | ICD-10-CM | POA: Diagnosis not present

## 2022-09-21 DIAGNOSIS — L03116 Cellulitis of left lower limb: Secondary | ICD-10-CM | POA: Diagnosis not present

## 2022-10-06 DIAGNOSIS — S72142A Displaced intertrochanteric fracture of left femur, initial encounter for closed fracture: Secondary | ICD-10-CM | POA: Diagnosis not present

## 2022-10-12 ENCOUNTER — Other Ambulatory Visit: Payer: Self-pay | Admitting: Family Medicine

## 2022-10-12 MED ORDER — TRAZODONE HCL 50 MG PO TABS: 25.0000 mg | ORAL_TABLET | Freq: Every evening | ORAL | 3 refills | Status: AC | PRN

## 2022-10-15 DIAGNOSIS — R339 Retention of urine, unspecified: Secondary | ICD-10-CM | POA: Diagnosis not present

## 2022-10-15 DIAGNOSIS — D508 Other iron deficiency anemias: Secondary | ICD-10-CM | POA: Diagnosis not present

## 2022-10-15 DIAGNOSIS — M1990 Unspecified osteoarthritis, unspecified site: Secondary | ICD-10-CM | POA: Diagnosis not present

## 2022-10-15 DIAGNOSIS — R5381 Other malaise: Secondary | ICD-10-CM | POA: Diagnosis not present

## 2022-10-15 DIAGNOSIS — U071 COVID-19: Secondary | ICD-10-CM | POA: Diagnosis not present

## 2022-10-15 DIAGNOSIS — L0390 Cellulitis, unspecified: Secondary | ICD-10-CM | POA: Diagnosis not present

## 2022-10-20 DIAGNOSIS — Z79899 Other long term (current) drug therapy: Secondary | ICD-10-CM | POA: Diagnosis not present

## 2022-10-20 DIAGNOSIS — D518 Other vitamin B12 deficiency anemias: Secondary | ICD-10-CM | POA: Diagnosis not present

## 2022-10-20 DIAGNOSIS — E559 Vitamin D deficiency, unspecified: Secondary | ICD-10-CM | POA: Diagnosis not present

## 2022-10-20 DIAGNOSIS — E038 Other specified hypothyroidism: Secondary | ICD-10-CM | POA: Diagnosis not present

## 2022-10-20 DIAGNOSIS — E119 Type 2 diabetes mellitus without complications: Secondary | ICD-10-CM | POA: Diagnosis not present

## 2022-10-20 DIAGNOSIS — E782 Mixed hyperlipidemia: Secondary | ICD-10-CM | POA: Diagnosis not present

## 2022-11-10 DIAGNOSIS — S72142A Displaced intertrochanteric fracture of left femur, initial encounter for closed fracture: Secondary | ICD-10-CM | POA: Diagnosis not present

## 2022-11-16 ENCOUNTER — Ambulatory Visit: Payer: Medicare PPO | Admitting: Family Medicine

## 2022-11-27 ENCOUNTER — Other Ambulatory Visit: Payer: Self-pay | Admitting: Physician Assistant

## 2022-12-13 DIAGNOSIS — S72002D Fracture of unspecified part of neck of left femur, subsequent encounter for closed fracture with routine healing: Secondary | ICD-10-CM | POA: Diagnosis not present

## 2022-12-17 DIAGNOSIS — M1990 Unspecified osteoarthritis, unspecified site: Secondary | ICD-10-CM | POA: Diagnosis not present

## 2022-12-17 DIAGNOSIS — R339 Retention of urine, unspecified: Secondary | ICD-10-CM | POA: Diagnosis not present

## 2022-12-17 DIAGNOSIS — B351 Tinea unguium: Secondary | ICD-10-CM | POA: Diagnosis not present

## 2022-12-17 DIAGNOSIS — F32A Depression, unspecified: Secondary | ICD-10-CM | POA: Diagnosis not present

## 2022-12-17 DIAGNOSIS — R5381 Other malaise: Secondary | ICD-10-CM | POA: Diagnosis not present

## 2022-12-17 DIAGNOSIS — S72142D Displaced intertrochanteric fracture of left femur, subsequent encounter for closed fracture with routine healing: Secondary | ICD-10-CM | POA: Diagnosis not present

## 2022-12-17 DIAGNOSIS — E559 Vitamin D deficiency, unspecified: Secondary | ICD-10-CM | POA: Diagnosis not present

## 2022-12-17 DIAGNOSIS — F0390 Unspecified dementia without behavioral disturbance: Secondary | ICD-10-CM | POA: Diagnosis not present

## 2022-12-17 DIAGNOSIS — I10 Essential (primary) hypertension: Secondary | ICD-10-CM | POA: Diagnosis not present

## 2022-12-17 DIAGNOSIS — D649 Anemia, unspecified: Secondary | ICD-10-CM | POA: Diagnosis not present

## 2022-12-17 DIAGNOSIS — Z7982 Long term (current) use of aspirin: Secondary | ICD-10-CM | POA: Diagnosis not present

## 2022-12-17 DIAGNOSIS — W19XXXD Unspecified fall, subsequent encounter: Secondary | ICD-10-CM | POA: Diagnosis not present

## 2022-12-17 DIAGNOSIS — R3981 Functional urinary incontinence: Secondary | ICD-10-CM | POA: Diagnosis not present

## 2022-12-23 DIAGNOSIS — R3981 Functional urinary incontinence: Secondary | ICD-10-CM | POA: Diagnosis not present

## 2022-12-23 DIAGNOSIS — F32A Depression, unspecified: Secondary | ICD-10-CM | POA: Diagnosis not present

## 2022-12-23 DIAGNOSIS — W19XXXD Unspecified fall, subsequent encounter: Secondary | ICD-10-CM | POA: Diagnosis not present

## 2022-12-23 DIAGNOSIS — F0390 Unspecified dementia without behavioral disturbance: Secondary | ICD-10-CM | POA: Diagnosis not present

## 2022-12-23 DIAGNOSIS — D649 Anemia, unspecified: Secondary | ICD-10-CM | POA: Diagnosis not present

## 2022-12-23 DIAGNOSIS — S72142D Displaced intertrochanteric fracture of left femur, subsequent encounter for closed fracture with routine healing: Secondary | ICD-10-CM | POA: Diagnosis not present

## 2022-12-23 DIAGNOSIS — Z7982 Long term (current) use of aspirin: Secondary | ICD-10-CM | POA: Diagnosis not present

## 2022-12-28 DIAGNOSIS — B351 Tinea unguium: Secondary | ICD-10-CM | POA: Diagnosis not present

## 2022-12-28 DIAGNOSIS — R5381 Other malaise: Secondary | ICD-10-CM | POA: Diagnosis not present

## 2022-12-28 DIAGNOSIS — F419 Anxiety disorder, unspecified: Secondary | ICD-10-CM | POA: Diagnosis not present

## 2022-12-28 DIAGNOSIS — R339 Retention of urine, unspecified: Secondary | ICD-10-CM | POA: Diagnosis not present

## 2022-12-28 DIAGNOSIS — E559 Vitamin D deficiency, unspecified: Secondary | ICD-10-CM | POA: Diagnosis not present

## 2022-12-28 DIAGNOSIS — M1990 Unspecified osteoarthritis, unspecified site: Secondary | ICD-10-CM | POA: Diagnosis not present

## 2022-12-28 DIAGNOSIS — Z79899 Other long term (current) drug therapy: Secondary | ICD-10-CM | POA: Diagnosis not present

## 2023-01-06 DIAGNOSIS — M79671 Pain in right foot: Secondary | ICD-10-CM | POA: Diagnosis not present

## 2023-01-06 DIAGNOSIS — L600 Ingrowing nail: Secondary | ICD-10-CM | POA: Diagnosis not present

## 2023-01-06 DIAGNOSIS — M79672 Pain in left foot: Secondary | ICD-10-CM | POA: Diagnosis not present

## 2023-01-06 DIAGNOSIS — B351 Tinea unguium: Secondary | ICD-10-CM | POA: Diagnosis not present

## 2023-01-07 DIAGNOSIS — R3981 Functional urinary incontinence: Secondary | ICD-10-CM | POA: Diagnosis not present

## 2023-01-07 DIAGNOSIS — F0390 Unspecified dementia without behavioral disturbance: Secondary | ICD-10-CM | POA: Diagnosis not present

## 2023-01-07 DIAGNOSIS — F32A Depression, unspecified: Secondary | ICD-10-CM | POA: Diagnosis not present

## 2023-01-07 DIAGNOSIS — D649 Anemia, unspecified: Secondary | ICD-10-CM | POA: Diagnosis not present

## 2023-01-07 DIAGNOSIS — W19XXXD Unspecified fall, subsequent encounter: Secondary | ICD-10-CM | POA: Diagnosis not present

## 2023-01-07 DIAGNOSIS — S72142D Displaced intertrochanteric fracture of left femur, subsequent encounter for closed fracture with routine healing: Secondary | ICD-10-CM | POA: Diagnosis not present

## 2023-01-07 DIAGNOSIS — Z7982 Long term (current) use of aspirin: Secondary | ICD-10-CM | POA: Diagnosis not present

## 2023-01-13 DIAGNOSIS — S72002D Fracture of unspecified part of neck of left femur, subsequent encounter for closed fracture with routine healing: Secondary | ICD-10-CM | POA: Diagnosis not present

## 2023-01-14 DIAGNOSIS — M1990 Unspecified osteoarthritis, unspecified site: Secondary | ICD-10-CM | POA: Diagnosis not present

## 2023-01-14 DIAGNOSIS — H539 Unspecified visual disturbance: Secondary | ICD-10-CM | POA: Diagnosis not present

## 2023-01-14 DIAGNOSIS — K5900 Constipation, unspecified: Secondary | ICD-10-CM | POA: Diagnosis not present

## 2023-01-14 DIAGNOSIS — I10 Essential (primary) hypertension: Secondary | ICD-10-CM | POA: Diagnosis not present

## 2023-01-14 DIAGNOSIS — R339 Retention of urine, unspecified: Secondary | ICD-10-CM | POA: Diagnosis not present

## 2023-01-14 DIAGNOSIS — B370 Candidal stomatitis: Secondary | ICD-10-CM | POA: Diagnosis not present

## 2023-01-14 DIAGNOSIS — E559 Vitamin D deficiency, unspecified: Secondary | ICD-10-CM | POA: Diagnosis not present

## 2023-01-14 DIAGNOSIS — F05 Delirium due to known physiological condition: Secondary | ICD-10-CM | POA: Diagnosis not present

## 2023-01-14 DIAGNOSIS — B351 Tinea unguium: Secondary | ICD-10-CM | POA: Diagnosis not present

## 2023-01-17 DIAGNOSIS — Z7982 Long term (current) use of aspirin: Secondary | ICD-10-CM | POA: Diagnosis not present

## 2023-01-17 DIAGNOSIS — S72142D Displaced intertrochanteric fracture of left femur, subsequent encounter for closed fracture with routine healing: Secondary | ICD-10-CM | POA: Diagnosis not present

## 2023-01-17 DIAGNOSIS — D649 Anemia, unspecified: Secondary | ICD-10-CM | POA: Diagnosis not present

## 2023-01-17 DIAGNOSIS — F0390 Unspecified dementia without behavioral disturbance: Secondary | ICD-10-CM | POA: Diagnosis not present

## 2023-01-17 DIAGNOSIS — W19XXXD Unspecified fall, subsequent encounter: Secondary | ICD-10-CM | POA: Diagnosis not present

## 2023-01-17 DIAGNOSIS — R3981 Functional urinary incontinence: Secondary | ICD-10-CM | POA: Diagnosis not present

## 2023-01-17 DIAGNOSIS — F32A Depression, unspecified: Secondary | ICD-10-CM | POA: Diagnosis not present

## 2023-01-18 DIAGNOSIS — K5900 Constipation, unspecified: Secondary | ICD-10-CM | POA: Diagnosis not present

## 2023-01-18 DIAGNOSIS — H539 Unspecified visual disturbance: Secondary | ICD-10-CM | POA: Diagnosis not present

## 2023-01-18 DIAGNOSIS — R4182 Altered mental status, unspecified: Secondary | ICD-10-CM | POA: Diagnosis not present

## 2023-01-18 DIAGNOSIS — N390 Urinary tract infection, site not specified: Secondary | ICD-10-CM | POA: Diagnosis not present

## 2023-01-18 DIAGNOSIS — M1990 Unspecified osteoarthritis, unspecified site: Secondary | ICD-10-CM | POA: Diagnosis not present

## 2023-01-18 DIAGNOSIS — F0390 Unspecified dementia without behavioral disturbance: Secondary | ICD-10-CM | POA: Diagnosis not present

## 2023-01-18 DIAGNOSIS — R339 Retention of urine, unspecified: Secondary | ICD-10-CM | POA: Diagnosis not present

## 2023-01-18 DIAGNOSIS — I10 Essential (primary) hypertension: Secondary | ICD-10-CM | POA: Diagnosis not present

## 2023-01-18 DIAGNOSIS — B370 Candidal stomatitis: Secondary | ICD-10-CM | POA: Diagnosis not present

## 2023-01-21 DIAGNOSIS — Z79899 Other long term (current) drug therapy: Secondary | ICD-10-CM | POA: Diagnosis not present

## 2023-01-21 DIAGNOSIS — D518 Other vitamin B12 deficiency anemias: Secondary | ICD-10-CM | POA: Diagnosis not present

## 2023-01-21 DIAGNOSIS — E038 Other specified hypothyroidism: Secondary | ICD-10-CM | POA: Diagnosis not present

## 2023-01-21 DIAGNOSIS — E782 Mixed hyperlipidemia: Secondary | ICD-10-CM | POA: Diagnosis not present

## 2023-01-21 DIAGNOSIS — E119 Type 2 diabetes mellitus without complications: Secondary | ICD-10-CM | POA: Diagnosis not present

## 2023-01-26 DIAGNOSIS — E119 Type 2 diabetes mellitus without complications: Secondary | ICD-10-CM | POA: Diagnosis not present

## 2023-01-26 DIAGNOSIS — E782 Mixed hyperlipidemia: Secondary | ICD-10-CM | POA: Diagnosis not present

## 2023-01-26 DIAGNOSIS — E038 Other specified hypothyroidism: Secondary | ICD-10-CM | POA: Diagnosis not present

## 2023-01-26 DIAGNOSIS — D518 Other vitamin B12 deficiency anemias: Secondary | ICD-10-CM | POA: Diagnosis not present

## 2023-01-26 DIAGNOSIS — E559 Vitamin D deficiency, unspecified: Secondary | ICD-10-CM | POA: Diagnosis not present

## 2023-02-03 DIAGNOSIS — Z7982 Long term (current) use of aspirin: Secondary | ICD-10-CM | POA: Diagnosis not present

## 2023-02-03 DIAGNOSIS — F32A Depression, unspecified: Secondary | ICD-10-CM | POA: Diagnosis not present

## 2023-02-03 DIAGNOSIS — F0390 Unspecified dementia without behavioral disturbance: Secondary | ICD-10-CM | POA: Diagnosis not present

## 2023-02-03 DIAGNOSIS — W19XXXD Unspecified fall, subsequent encounter: Secondary | ICD-10-CM | POA: Diagnosis not present

## 2023-02-03 DIAGNOSIS — R3981 Functional urinary incontinence: Secondary | ICD-10-CM | POA: Diagnosis not present

## 2023-02-03 DIAGNOSIS — S72142D Displaced intertrochanteric fracture of left femur, subsequent encounter for closed fracture with routine healing: Secondary | ICD-10-CM | POA: Diagnosis not present

## 2023-02-03 DIAGNOSIS — D649 Anemia, unspecified: Secondary | ICD-10-CM | POA: Diagnosis not present

## 2023-02-11 DIAGNOSIS — R5381 Other malaise: Secondary | ICD-10-CM | POA: Diagnosis not present

## 2023-02-11 DIAGNOSIS — S72002D Fracture of unspecified part of neck of left femur, subsequent encounter for closed fracture with routine healing: Secondary | ICD-10-CM | POA: Diagnosis not present

## 2023-02-11 DIAGNOSIS — K5900 Constipation, unspecified: Secondary | ICD-10-CM | POA: Diagnosis not present

## 2023-02-11 DIAGNOSIS — R339 Retention of urine, unspecified: Secondary | ICD-10-CM | POA: Diagnosis not present

## 2023-02-11 DIAGNOSIS — M1990 Unspecified osteoarthritis, unspecified site: Secondary | ICD-10-CM | POA: Diagnosis not present

## 2023-02-11 DIAGNOSIS — B351 Tinea unguium: Secondary | ICD-10-CM | POA: Diagnosis not present

## 2023-02-11 DIAGNOSIS — I10 Essential (primary) hypertension: Secondary | ICD-10-CM | POA: Diagnosis not present

## 2023-02-11 DIAGNOSIS — F0390 Unspecified dementia without behavioral disturbance: Secondary | ICD-10-CM | POA: Diagnosis not present

## 2023-02-11 DIAGNOSIS — E559 Vitamin D deficiency, unspecified: Secondary | ICD-10-CM | POA: Diagnosis not present

## 2023-02-14 DIAGNOSIS — W19XXXD Unspecified fall, subsequent encounter: Secondary | ICD-10-CM | POA: Diagnosis not present

## 2023-02-14 DIAGNOSIS — F32A Depression, unspecified: Secondary | ICD-10-CM | POA: Diagnosis not present

## 2023-02-14 DIAGNOSIS — Z7982 Long term (current) use of aspirin: Secondary | ICD-10-CM | POA: Diagnosis not present

## 2023-02-14 DIAGNOSIS — F0390 Unspecified dementia without behavioral disturbance: Secondary | ICD-10-CM | POA: Diagnosis not present

## 2023-02-14 DIAGNOSIS — D649 Anemia, unspecified: Secondary | ICD-10-CM | POA: Diagnosis not present

## 2023-02-14 DIAGNOSIS — R3981 Functional urinary incontinence: Secondary | ICD-10-CM | POA: Diagnosis not present

## 2023-02-14 DIAGNOSIS — S72142D Displaced intertrochanteric fracture of left femur, subsequent encounter for closed fracture with routine healing: Secondary | ICD-10-CM | POA: Diagnosis not present

## 2023-02-21 DIAGNOSIS — E559 Vitamin D deficiency, unspecified: Secondary | ICD-10-CM | POA: Diagnosis not present

## 2023-02-21 DIAGNOSIS — E038 Other specified hypothyroidism: Secondary | ICD-10-CM | POA: Diagnosis not present

## 2023-02-21 DIAGNOSIS — D518 Other vitamin B12 deficiency anemias: Secondary | ICD-10-CM | POA: Diagnosis not present

## 2023-02-21 DIAGNOSIS — E782 Mixed hyperlipidemia: Secondary | ICD-10-CM | POA: Diagnosis not present

## 2023-02-21 DIAGNOSIS — E119 Type 2 diabetes mellitus without complications: Secondary | ICD-10-CM | POA: Diagnosis not present

## 2023-02-22 DIAGNOSIS — D649 Anemia, unspecified: Secondary | ICD-10-CM | POA: Diagnosis not present

## 2023-02-22 DIAGNOSIS — F32A Depression, unspecified: Secondary | ICD-10-CM | POA: Diagnosis not present

## 2023-02-22 DIAGNOSIS — W19XXXD Unspecified fall, subsequent encounter: Secondary | ICD-10-CM | POA: Diagnosis not present

## 2023-02-22 DIAGNOSIS — S72142D Displaced intertrochanteric fracture of left femur, subsequent encounter for closed fracture with routine healing: Secondary | ICD-10-CM | POA: Diagnosis not present

## 2023-02-22 DIAGNOSIS — R3981 Functional urinary incontinence: Secondary | ICD-10-CM | POA: Diagnosis not present

## 2023-02-22 DIAGNOSIS — Z7982 Long term (current) use of aspirin: Secondary | ICD-10-CM | POA: Diagnosis not present

## 2023-02-22 DIAGNOSIS — F0390 Unspecified dementia without behavioral disturbance: Secondary | ICD-10-CM | POA: Diagnosis not present

## 2023-03-09 DIAGNOSIS — R5381 Other malaise: Secondary | ICD-10-CM | POA: Diagnosis not present

## 2023-03-09 DIAGNOSIS — M1990 Unspecified osteoarthritis, unspecified site: Secondary | ICD-10-CM | POA: Diagnosis not present

## 2023-03-09 DIAGNOSIS — R350 Frequency of micturition: Secondary | ICD-10-CM | POA: Diagnosis not present

## 2023-03-09 DIAGNOSIS — R339 Retention of urine, unspecified: Secondary | ICD-10-CM | POA: Diagnosis not present

## 2023-03-09 DIAGNOSIS — F32A Depression, unspecified: Secondary | ICD-10-CM | POA: Diagnosis not present

## 2023-03-09 DIAGNOSIS — R4182 Altered mental status, unspecified: Secondary | ICD-10-CM | POA: Diagnosis not present

## 2023-03-09 DIAGNOSIS — I10 Essential (primary) hypertension: Secondary | ICD-10-CM | POA: Diagnosis not present

## 2023-03-09 DIAGNOSIS — F0390 Unspecified dementia without behavioral disturbance: Secondary | ICD-10-CM | POA: Diagnosis not present

## 2023-03-09 DIAGNOSIS — K5900 Constipation, unspecified: Secondary | ICD-10-CM | POA: Diagnosis not present

## 2023-03-14 DIAGNOSIS — S72002D Fracture of unspecified part of neck of left femur, subsequent encounter for closed fracture with routine healing: Secondary | ICD-10-CM | POA: Diagnosis not present

## 2023-03-17 DIAGNOSIS — B351 Tinea unguium: Secondary | ICD-10-CM | POA: Diagnosis not present

## 2023-03-17 DIAGNOSIS — N390 Urinary tract infection, site not specified: Secondary | ICD-10-CM | POA: Diagnosis not present

## 2023-03-17 DIAGNOSIS — R5381 Other malaise: Secondary | ICD-10-CM | POA: Diagnosis not present

## 2023-03-17 DIAGNOSIS — M1990 Unspecified osteoarthritis, unspecified site: Secondary | ICD-10-CM | POA: Diagnosis not present

## 2023-03-17 DIAGNOSIS — E559 Vitamin D deficiency, unspecified: Secondary | ICD-10-CM | POA: Diagnosis not present

## 2023-03-17 DIAGNOSIS — K5900 Constipation, unspecified: Secondary | ICD-10-CM | POA: Diagnosis not present

## 2023-03-17 DIAGNOSIS — I10 Essential (primary) hypertension: Secondary | ICD-10-CM | POA: Diagnosis not present

## 2023-03-17 DIAGNOSIS — R339 Retention of urine, unspecified: Secondary | ICD-10-CM | POA: Diagnosis not present

## 2023-03-18 DIAGNOSIS — N390 Urinary tract infection, site not specified: Secondary | ICD-10-CM | POA: Diagnosis not present

## 2023-03-18 DIAGNOSIS — F32A Depression, unspecified: Secondary | ICD-10-CM | POA: Diagnosis not present

## 2023-03-18 DIAGNOSIS — I10 Essential (primary) hypertension: Secondary | ICD-10-CM | POA: Diagnosis not present

## 2023-03-18 DIAGNOSIS — M1990 Unspecified osteoarthritis, unspecified site: Secondary | ICD-10-CM | POA: Diagnosis not present

## 2023-03-18 DIAGNOSIS — K5900 Constipation, unspecified: Secondary | ICD-10-CM | POA: Diagnosis not present

## 2023-03-18 DIAGNOSIS — R339 Retention of urine, unspecified: Secondary | ICD-10-CM | POA: Diagnosis not present

## 2023-03-18 DIAGNOSIS — F0390 Unspecified dementia without behavioral disturbance: Secondary | ICD-10-CM | POA: Diagnosis not present

## 2023-03-18 DIAGNOSIS — R5381 Other malaise: Secondary | ICD-10-CM | POA: Diagnosis not present

## 2023-03-18 DIAGNOSIS — R059 Cough, unspecified: Secondary | ICD-10-CM | POA: Diagnosis not present

## 2023-03-21 DIAGNOSIS — D518 Other vitamin B12 deficiency anemias: Secondary | ICD-10-CM | POA: Diagnosis not present

## 2023-03-21 DIAGNOSIS — E559 Vitamin D deficiency, unspecified: Secondary | ICD-10-CM | POA: Diagnosis not present

## 2023-03-21 DIAGNOSIS — E038 Other specified hypothyroidism: Secondary | ICD-10-CM | POA: Diagnosis not present

## 2023-03-21 DIAGNOSIS — E782 Mixed hyperlipidemia: Secondary | ICD-10-CM | POA: Diagnosis not present

## 2023-03-21 DIAGNOSIS — E119 Type 2 diabetes mellitus without complications: Secondary | ICD-10-CM | POA: Diagnosis not present
